# Patient Record
Sex: Female | Born: 1955
Health system: Southern US, Community
[De-identification: ages and names within clinical notes are randomized; demographics above are authoritative.]

## PROBLEM LIST (undated history)

## (undated) DIAGNOSIS — E7800 Pure hypercholesterolemia, unspecified: Secondary | ICD-10-CM

## (undated) DIAGNOSIS — I10 Essential (primary) hypertension: Secondary | ICD-10-CM

## (undated) DIAGNOSIS — E119 Type 2 diabetes mellitus without complications: Secondary | ICD-10-CM

## (undated) DIAGNOSIS — Z9289 Personal history of other medical treatment: Secondary | ICD-10-CM

## (undated) HISTORY — DX: Morbid (severe) obesity due to excess calories: E66.01

## (undated) HISTORY — DX: Personal history of other medical treatment: Z92.89

## (undated) HISTORY — DX: Pure hypercholesterolemia, unspecified: E78.00

## (undated) HISTORY — DX: Essential (primary) hypertension: I10

---

## 2001-06-29 HISTORY — PX: PARTIAL HYSTERECTOMY: SHX80

## 2010-05-20 ENCOUNTER — Encounter: Payer: Self-pay | Admitting: Obstetrics and Gynecology

## 2010-05-20 ENCOUNTER — Inpatient Hospital Stay (HOSPITAL_COMMUNITY): Admission: RE | Admit: 2010-05-20 | Discharge: 2010-05-22 | Payer: Self-pay | Admitting: Obstetrics and Gynecology

## 2010-12-13 LAB — BASIC METABOLIC PANEL
BUN: 11 mg/dL (ref 6–23)
CO2: 28 mEq/L (ref 19–32)
Calcium: 9.1 mg/dL (ref 8.4–10.5)
Chloride: 105 mEq/L (ref 96–112)
Creatinine, Ser: 0.83 mg/dL (ref 0.4–1.2)
Glucose, Bld: 110 mg/dL — ABNORMAL HIGH (ref 70–99)

## 2010-12-13 LAB — CBC
HCT: 38 % (ref 36.0–46.0)
MCH: 27.4 pg (ref 26.0–34.0)
MCH: 28 pg (ref 26.0–34.0)
MCHC: 33 g/dL (ref 30.0–36.0)
MCHC: 33.7 g/dL (ref 30.0–36.0)
MCV: 82.8 fL (ref 78.0–100.0)
MCV: 83 fL (ref 78.0–100.0)
Platelets: 223 10*3/uL (ref 150–400)
Platelets: 269 10*3/uL (ref 150–400)
RDW: 14.8 % (ref 11.5–15.5)
WBC: 8.5 10*3/uL (ref 4.0–10.5)

## 2011-01-27 DIAGNOSIS — Z9289 Personal history of other medical treatment: Secondary | ICD-10-CM

## 2011-01-27 HISTORY — DX: Personal history of other medical treatment: Z92.89

## 2011-06-10 ENCOUNTER — Other Ambulatory Visit (HOSPITAL_COMMUNITY)
Admission: RE | Admit: 2011-06-10 | Discharge: 2011-06-10 | Disposition: A | Discharge status: Home / Self Care | Payer: BC Managed Care – PPO | Source: Ambulatory Visit | Attending: Obstetrics and Gynecology | Admitting: Obstetrics and Gynecology

## 2011-06-10 ENCOUNTER — Other Ambulatory Visit: Payer: Self-pay | Admitting: Obstetrics and Gynecology

## 2011-06-10 DIAGNOSIS — Z1272 Encounter for screening for malignant neoplasm of vagina: Secondary | ICD-10-CM | POA: Insufficient documentation

## 2011-06-10 DIAGNOSIS — Z1159 Encounter for screening for other viral diseases: Secondary | ICD-10-CM | POA: Insufficient documentation

## 2012-06-29 ENCOUNTER — Other Ambulatory Visit (HOSPITAL_COMMUNITY)
Admission: RE | Admit: 2012-06-29 | Discharge: 2012-06-29 | Disposition: A | Discharge status: Home / Self Care | Payer: BC Managed Care – PPO | Source: Ambulatory Visit | Attending: Obstetrics and Gynecology | Admitting: Obstetrics and Gynecology

## 2012-06-29 ENCOUNTER — Other Ambulatory Visit: Payer: Self-pay | Admitting: Obstetrics and Gynecology

## 2012-06-29 DIAGNOSIS — Z1151 Encounter for screening for human papillomavirus (HPV): Secondary | ICD-10-CM | POA: Insufficient documentation

## 2012-06-29 DIAGNOSIS — Z124 Encounter for screening for malignant neoplasm of cervix: Secondary | ICD-10-CM | POA: Insufficient documentation

## 2013-05-26 ENCOUNTER — Encounter: Payer: Self-pay | Admitting: *Deleted

## 2013-05-27 ENCOUNTER — Encounter: Payer: Self-pay | Admitting: Cardiovascular Disease

## 2013-05-31 ENCOUNTER — Ambulatory Visit: Payer: BC Managed Care – PPO | Admitting: Cardiovascular Disease

## 2013-06-10 ENCOUNTER — Ambulatory Visit: Payer: BC Managed Care – PPO | Admitting: Cardiovascular Disease

## 2014-01-16 ENCOUNTER — Other Ambulatory Visit: Payer: Self-pay | Admitting: Internal Medicine

## 2014-01-16 DIAGNOSIS — Z139 Encounter for screening, unspecified: Secondary | ICD-10-CM

## 2014-01-16 DIAGNOSIS — Z780 Asymptomatic menopausal state: Secondary | ICD-10-CM

## 2014-01-17 ENCOUNTER — Ambulatory Visit: Payer: Self-pay | Admitting: Podiatry

## 2014-01-18 ENCOUNTER — Ambulatory Visit: Payer: Self-pay | Admitting: Podiatry

## 2014-01-27 ENCOUNTER — Ambulatory Visit: Payer: Self-pay | Admitting: Podiatry

## 2015-11-17 ENCOUNTER — Emergency Department (HOSPITAL_BASED_OUTPATIENT_CLINIC_OR_DEPARTMENT_OTHER): Payer: BLUE CROSS/BLUE SHIELD

## 2015-11-17 ENCOUNTER — Emergency Department (HOSPITAL_BASED_OUTPATIENT_CLINIC_OR_DEPARTMENT_OTHER)
Admission: EM | Admit: 2015-11-17 | Discharge: 2015-11-17 | Disposition: A | Discharge status: Home / Self Care | Payer: BLUE CROSS/BLUE SHIELD | Attending: Emergency Medicine | Admitting: Emergency Medicine

## 2015-11-17 ENCOUNTER — Encounter (HOSPITAL_BASED_OUTPATIENT_CLINIC_OR_DEPARTMENT_OTHER): Payer: Self-pay

## 2015-11-17 DIAGNOSIS — Z7982 Long term (current) use of aspirin: Secondary | ICD-10-CM | POA: Diagnosis not present

## 2015-11-17 DIAGNOSIS — E7800 Pure hypercholesterolemia, unspecified: Secondary | ICD-10-CM | POA: Diagnosis not present

## 2015-11-17 DIAGNOSIS — Z79899 Other long term (current) drug therapy: Secondary | ICD-10-CM | POA: Diagnosis not present

## 2015-11-17 DIAGNOSIS — M791 Myalgia: Secondary | ICD-10-CM | POA: Diagnosis not present

## 2015-11-17 DIAGNOSIS — Z7984 Long term (current) use of oral hypoglycemic drugs: Secondary | ICD-10-CM | POA: Insufficient documentation

## 2015-11-17 DIAGNOSIS — R509 Fever, unspecified: Secondary | ICD-10-CM | POA: Insufficient documentation

## 2015-11-17 DIAGNOSIS — R05 Cough: Secondary | ICD-10-CM | POA: Insufficient documentation

## 2015-11-17 DIAGNOSIS — R51 Headache: Secondary | ICD-10-CM | POA: Insufficient documentation

## 2015-11-17 DIAGNOSIS — R6889 Other general symptoms and signs: Secondary | ICD-10-CM

## 2015-11-17 DIAGNOSIS — R0981 Nasal congestion: Secondary | ICD-10-CM | POA: Diagnosis not present

## 2015-11-17 DIAGNOSIS — I10 Essential (primary) hypertension: Secondary | ICD-10-CM | POA: Diagnosis not present

## 2015-11-17 LAB — CBC WITH DIFFERENTIAL/PLATELET
BASOS ABS: 0 10*3/uL (ref 0.0–0.1)
Basophils Relative: 0 %
EOS ABS: 0 10*3/uL (ref 0.0–0.7)
EOS PCT: 0 %
HCT: 35 % — ABNORMAL LOW (ref 36.0–46.0)
Hemoglobin: 11.3 g/dL — ABNORMAL LOW (ref 12.0–15.0)
LYMPHS PCT: 8 %
Lymphs Abs: 0.8 10*3/uL (ref 0.7–4.0)
MCH: 25.2 pg — ABNORMAL LOW (ref 26.0–34.0)
MCHC: 32.3 g/dL (ref 30.0–36.0)
MCV: 78.1 fL (ref 78.0–100.0)
Monocytes Absolute: 1.3 10*3/uL — ABNORMAL HIGH (ref 0.1–1.0)
Monocytes Relative: 13 %
NEUTROS PCT: 79 %
Neutro Abs: 7.3 10*3/uL (ref 1.7–7.7)
PLATELETS: 218 10*3/uL (ref 150–400)
RBC: 4.48 MIL/uL (ref 3.87–5.11)
RDW: 14.8 % (ref 11.5–15.5)
WBC: 9.4 10*3/uL (ref 4.0–10.5)

## 2015-11-17 LAB — BASIC METABOLIC PANEL
ANION GAP: 7 (ref 5–15)
BUN: 12 mg/dL (ref 6–20)
CO2: 24 mmol/L (ref 22–32)
Calcium: 8.6 mg/dL — ABNORMAL LOW (ref 8.9–10.3)
Chloride: 105 mmol/L (ref 101–111)
Creatinine, Ser: 0.74 mg/dL (ref 0.44–1.00)
GFR calc Af Amer: >60 mL/min (ref >60)
Glucose, Bld: 110 mg/dL — ABNORMAL HIGH (ref 65–99)
POTASSIUM: 3.3 mmol/L — AB (ref 3.5–5.1)
SODIUM: 136 mmol/L (ref 135–145)

## 2015-11-17 MED ORDER — FENTANYL CITRATE (PF) 100 MCG/2ML IJ SOLN
50.0000 ug | Freq: Once | INTRAMUSCULAR | Status: AC
  Administered 2015-11-17: 50 ug via INTRAVENOUS
  Filled 2015-11-17: qty 2

## 2015-11-17 MED ORDER — DM-GUAIFENESIN ER 30-600 MG PO TB12: 1.0000 | ORAL_TABLET | Freq: Two times a day (BID) | ORAL | Status: DC

## 2015-11-17 MED ORDER — SODIUM CHLORIDE 0.9 % IV BOLUS (SEPSIS)
1000.0000 mL | Freq: Once | INTRAVENOUS | Status: AC
  Administered 2015-11-17: 1000 mL via INTRAVENOUS

## 2015-11-17 MED ORDER — ACETAMINOPHEN 325 MG PO TABS
650.0000 mg | ORAL_TABLET | Freq: Once | ORAL | Status: AC
  Administered 2015-11-17: 650 mg via ORAL
  Filled 2015-11-17: qty 2

## 2015-11-17 MED ORDER — NAPROXEN 500 MG PO TABS: 500.0000 mg | ORAL_TABLET | Freq: Two times a day (BID) | ORAL | Status: DC

## 2015-11-17 MED ORDER — SODIUM CHLORIDE 0.9 % IV SOLN: INTRAVENOUS | Status: DC

## 2015-11-17 MED ORDER — OSELTAMIVIR PHOSPHATE 75 MG PO CAPS: 75.0000 mg | ORAL_CAPSULE | Freq: Two times a day (BID) | ORAL | Status: DC

## 2015-11-17 NOTE — Discharge Instructions (Signed)
Symptoms seem to be consistent with the flu. Take Tamiflu as directed. Take the Naprosyn for the bodyaches and headache. Take Tylenol for fever. Take Mucinex DM for cough and phlegm. Return for any new or worse symptoms. Drink plenty of fluids.

## 2015-11-17 NOTE — ED Notes (Signed)
Patient here with cough, body aches, fever x 1 day. Reports that the symptoms came on suddenly with headache. Has not taken otc meds pta. Alert and oriented

## 2015-11-17 NOTE — ED Provider Notes (Signed)
CSN: [id number]     Arrival date & time 11/17/15  1032 History   First MD Initiated Contact with Patient 11/17/15 1042     No chief complaint on file.    (Consider location/radiation/quality/duration/timing/severity/associated sxs/prior Treatment) The history is provided by the patient.   60 year old female acute onset last evening of cough occasionally productive bodyaches fever congestion. No nausea vomiting or diarrhea. No known sick contact.  Past Medical History  Diagnosis Date  . Morbid obesity (HCC)   . Hypertension   . Hypercholesterolemia   . History of stress test 01/27/2011    normal myocardial perfusion study, no significant abnormalities.   Past Surgical History  Procedure Laterality Date  . Partial hysterectomy  06/29/2001   Family History  Problem Relation Age of Onset  . Hypertension Sister   . Hyperlipidemia Sister   . Heart disease Father   . Hypertension Father   . Cancer Paternal Grandmother    Social History  Substance Use Topics  . Smoking status: Never Smoker   . Smokeless tobacco: None  . Alcohol Use: None   OB History    No data available     Review of Systems  Constitutional: Positive for fever.  HENT: Positive for congestion.   Eyes: Negative for redness.  Respiratory: Positive for cough. Negative for shortness of breath and wheezing.   Cardiovascular: Negative for chest pain.  Gastrointestinal: Negative for nausea, vomiting, abdominal pain and diarrhea.  Genitourinary: Negative for dysuria.  Musculoskeletal: Positive for myalgias. Negative for neck stiffness.  Skin: Negative for rash.  Neurological: Positive for headaches.  Hematological: Does not bruise/bleed easily.  Psychiatric/Behavioral: Negative for confusion.      Allergies  Review of patient's allergies indicates no known allergies.  Home Medications   Prior to Admission medications   Medication Sig Start Date End Date Taking? Authorizing Provider  metFORMIN  (GLUCOPHAGE) 1000 MG tablet Take 1,000 mg by mouth 2 (two) times daily with a meal.   Yes Historical Provider, MD  aspirin 325 MG tablet Take 325 mg by mouth daily.    Historical Provider, MD  CINNAMON PO Take 1 tablet by mouth 2 (two) times daily.    Historical Provider, MD  dextromethorphan-guaiFENesin (MUCINEX DM) 30-600 MG 12hr tablet Take 1 tablet by mouth 2 (two) times daily. 11/17/15   Vanetta Mulders, MD  gabapentin (NEURONTIN) 300 MG capsule Take 300 mg by mouth daily.    Historical Provider, MD  losartan (COZAAR) 25 MG tablet Take 25 mg by mouth daily.    Historical Provider, MD  lovastatin (MEVACOR) 20 MG tablet Take 20 mg by mouth at bedtime.    Historical Provider, MD  naproxen (NAPROSYN) 500 MG tablet Take 1 tablet (500 mg total) by mouth 2 (two) times daily. 11/17/15   Vanetta Mulders, MD  oseltamivir (TAMIFLU) 75 MG capsule Take 1 capsule (75 mg total) by mouth every 12 (twelve) hours. 11/17/15   Vanetta Mulders, MD   BP 128/64 mmHg  Pulse 102  Temp(Src) 100.8 F (38.2 C) (Oral)  Resp 24  Ht  (1.575 m)  Wt 106.142 kg  BMI 42.79 kg/m2  SpO2 96% Physical Exam  Constitutional: She is oriented to person, place, and time. She appears well-developed and well-nourished. No distress.  HENT:  Head: Normocephalic and atraumatic.  Mouth/Throat: No oropharyngeal exudate.  Eyes: Conjunctivae and EOM are normal. Pupils are equal, round, and reactive to light.  Neck: Normal range of motion. Neck supple.  Cardiovascular: Normal rate, regular rhythm and  normal heart sounds.   No murmur heard. Pulmonary/Chest: Effort normal and breath sounds normal. No respiratory distress. She has no wheezes. She has no rales.  Abdominal: Soft. Bowel sounds are normal. There is no tenderness.  Musculoskeletal: Normal range of motion.  Neurological: She is alert and oriented to person, place, and time. No cranial nerve deficit. She exhibits normal muscle tone. Coordination normal.  Skin: Skin is warm.   Nursing note and vitals reviewed.   ED Course  Procedures (including critical care time) Labs Review Labs Reviewed  CBC WITH DIFFERENTIAL/PLATELET - Abnormal; Notable for the following:    Hemoglobin 11.3 (*)    HCT 35.0 (*)    MCH 25.2 (*)    Monocytes Absolute 1.3 (*)    All other components within normal limits  BASIC METABOLIC PANEL - Abnormal; Notable for the following:    Potassium 3.3 (*)    Glucose, Bld 110 (*)    Calcium 8.6 (*)    All other components within normal limits   Results for orders placed or performed during the hospital encounter of 11/17/15  CBC with Differential/Platelet  Result Value Ref Range   WBC 9.4 4.0 - 10.5 K/uL   RBC 4.48 3.87 - 5.11 MIL/uL   Hemoglobin 11.3 (L) 12.0 - 15.0 g/dL   HCT 16.1 (L) 09.6 - 04.5 %   MCV 78.1 78.0 - 100.0 fL   MCH 25.2 (L) 26.0 - 34.0 pg   MCHC 32.3 30.0 - 36.0 g/dL   RDW 40.9 81.1 - 91.4 %   Platelets 218 150 - 400 K/uL   Neutrophils Relative % 79 %   Neutro Abs 7.3 1.7 - 7.7 K/uL   Lymphocytes Relative 8 %   Lymphs Abs 0.8 0.7 - 4.0 K/uL   Monocytes Relative 13 %   Monocytes Absolute 1.3 (H) 0.1 - 1.0 K/uL   Eosinophils Relative 0 %   Eosinophils Absolute 0.0 0.0 - 0.7 K/uL   Basophils Relative 0 %   Basophils Absolute 0.0 0.0 - 0.1 K/uL  Basic metabolic panel  Result Value Ref Range   Sodium 136 135 - 145 mmol/L   Potassium 3.3 (L) 3.5 - 5.1 mmol/L   Chloride 105 101 - 111 mmol/L   CO2 24 22 - 32 mmol/L   Glucose, Bld 110 (H) 65 - 99 mg/dL   BUN 12 6 - 20 mg/dL   Creatinine, Ser 7.82 0.44 - 1.00 mg/dL   Calcium 8.6 (L) 8.9 - 10.3 mg/dL   GFR calc non Af Amer >60 >60 mL/min   GFR calc Af Amer >60 >60 mL/min   Anion gap 7 5 - 15     Imaging Review Dg Chest 2 View  11/17/2015  CLINICAL DATA:  Initial encounter. 60 y/o female here with c/o body aches, productive cough, sternal CP, headache, fever x2 days. Hx HTN - on meds, non-smoker EXAM: CHEST  2 VIEW COMPARISON:  None. FINDINGS: The heart size  and mediastinal contours are within normal limits. Both lungs are clear. No pleural effusion or pneumothorax. The visualized skeletal structures are unremarkable. IMPRESSION: No active cardiopulmonary disease. Electronically Signed   By: Amie Portland M.D.   On: 11/17/2015 11:25   I have personally reviewed and evaluated these images and lab results as part of my medical decision-making.   EKG Interpretation None      MDM   Final diagnoses:  Flu-like symptoms    Patient with acute onset of bodyaches fever cough yesterday evening. Also cough  is productive. Febrile here improved with Tylenol. Chest x-ray negative for pneumonia. Symptoms consistent with the flu. Patient will be treated symptomatically with Tamiflu Mucinex DM Naprosyn and Tylenol.    Vanetta Mulders, MD 11/17/15 1300

## 2016-06-17 ENCOUNTER — Observation Stay (HOSPITAL_BASED_OUTPATIENT_CLINIC_OR_DEPARTMENT_OTHER)
Admission: EM | Admit: 2016-06-17 | Discharge: 2016-06-20 | Disposition: A | Discharge status: Home / Self Care | Payer: BLUE CROSS/BLUE SHIELD | Attending: General Surgery | Admitting: General Surgery

## 2016-06-17 ENCOUNTER — Emergency Department (HOSPITAL_BASED_OUTPATIENT_CLINIC_OR_DEPARTMENT_OTHER): Payer: BLUE CROSS/BLUE SHIELD

## 2016-06-17 ENCOUNTER — Encounter (HOSPITAL_BASED_OUTPATIENT_CLINIC_OR_DEPARTMENT_OTHER): Payer: Self-pay | Admitting: Emergency Medicine

## 2016-06-17 ENCOUNTER — Emergency Department (HOSPITAL_COMMUNITY): Payer: BLUE CROSS/BLUE SHIELD

## 2016-06-17 DIAGNOSIS — Z7984 Long term (current) use of oral hypoglycemic drugs: Secondary | ICD-10-CM | POA: Insufficient documentation

## 2016-06-17 DIAGNOSIS — E119 Type 2 diabetes mellitus without complications: Secondary | ICD-10-CM | POA: Insufficient documentation

## 2016-06-17 DIAGNOSIS — R109 Unspecified abdominal pain: Secondary | ICD-10-CM | POA: Diagnosis present

## 2016-06-17 DIAGNOSIS — K8020 Calculus of gallbladder without cholecystitis without obstruction: Secondary | ICD-10-CM

## 2016-06-17 DIAGNOSIS — E7800 Pure hypercholesterolemia, unspecified: Secondary | ICD-10-CM | POA: Insufficient documentation

## 2016-06-17 DIAGNOSIS — Z7982 Long term (current) use of aspirin: Secondary | ICD-10-CM | POA: Diagnosis not present

## 2016-06-17 DIAGNOSIS — Z6841 Body Mass Index (BMI) 40.0 and over, adult: Secondary | ICD-10-CM | POA: Diagnosis not present

## 2016-06-17 DIAGNOSIS — I10 Essential (primary) hypertension: Secondary | ICD-10-CM | POA: Insufficient documentation

## 2016-06-17 DIAGNOSIS — R1010 Upper abdominal pain, unspecified: Secondary | ICD-10-CM

## 2016-06-17 DIAGNOSIS — K8012 Calculus of gallbladder with acute and chronic cholecystitis without obstruction: Principal | ICD-10-CM | POA: Insufficient documentation

## 2016-06-17 DIAGNOSIS — Z79899 Other long term (current) drug therapy: Secondary | ICD-10-CM | POA: Diagnosis not present

## 2016-06-17 HISTORY — DX: Type 2 diabetes mellitus without complications: E11.9

## 2016-06-17 LAB — GLUCOSE, CAPILLARY
GLUCOSE-CAPILLARY: 115 mg/dL — AB (ref 65–99)
GLUCOSE-CAPILLARY: 133 mg/dL — AB (ref 65–99)
Glucose-Capillary: 108 mg/dL — ABNORMAL HIGH (ref 65–99)

## 2016-06-17 LAB — URINALYSIS, ROUTINE W REFLEX MICROSCOPIC
BILIRUBIN URINE: NEGATIVE
Glucose, UA: 100 mg/dL — AB
Ketones, ur: NEGATIVE mg/dL
Leukocytes, UA: NEGATIVE
Nitrite: NEGATIVE
PH: 7.5 (ref 5.0–8.0)
Protein, ur: NEGATIVE mg/dL
SPECIFIC GRAVITY, URINE: 1.031 — AB (ref 1.005–1.030)

## 2016-06-17 LAB — CREATININE, SERUM
Creatinine, Ser: 0.79 mg/dL (ref 0.44–1.00)
GFR calc non Af Amer: >60 mL/min (ref >60)

## 2016-06-17 LAB — COMPREHENSIVE METABOLIC PANEL
ALK PHOS: 72 U/L (ref 38–126)
ALT: 24 U/L (ref 14–54)
AST: 25 U/L (ref 15–41)
Albumin: 4 g/dL (ref 3.5–5.0)
Anion gap: 7 (ref 5–15)
BUN: 14 mg/dL (ref 6–20)
CALCIUM: 9.1 mg/dL (ref 8.9–10.3)
CHLORIDE: 106 mmol/L (ref 101–111)
CO2: 25 mmol/L (ref 22–32)
CREATININE: 0.77 mg/dL (ref 0.44–1.00)
GFR calc non Af Amer: >60 mL/min (ref >60)
Glucose, Bld: 198 mg/dL — ABNORMAL HIGH (ref 65–99)
Potassium: 3.7 mmol/L (ref 3.5–5.1)
SODIUM: 138 mmol/L (ref 135–145)
Total Bilirubin: 0.2 mg/dL — ABNORMAL LOW (ref 0.3–1.2)
Total Protein: 7.2 g/dL (ref 6.5–8.1)

## 2016-06-17 LAB — CBC
HCT: 37 % (ref 36.0–46.0)
HEMATOCRIT: 37 % (ref 36.0–46.0)
HEMOGLOBIN: 11.8 g/dL — AB (ref 12.0–15.0)
Hemoglobin: 12.1 g/dL (ref 12.0–15.0)
MCH: 25.8 pg — AB (ref 26.0–34.0)
MCH: 25.2 pg — ABNORMAL LOW (ref 26.0–34.0)
MCHC: 32.7 g/dL (ref 30.0–36.0)
MCHC: 31.9 g/dL (ref 30.0–36.0)
MCV: 78.9 fL (ref 78.0–100.0)
MCV: 79.1 fL (ref 78.0–100.0)
Platelets: 238 10*3/uL (ref 150–400)
Platelets: 187 10*3/uL (ref 150–400)
RBC: 4.69 MIL/uL (ref 3.87–5.11)
RBC: 4.68 MIL/uL (ref 3.87–5.11)
RDW: 14.8 % (ref 11.5–15.5)
RDW: 14.9 % (ref 11.5–15.5)
WBC: 11.1 10*3/uL — ABNORMAL HIGH (ref 4.0–10.5)
WBC: 14.7 10*3/uL — AB (ref 4.0–10.5)

## 2016-06-17 LAB — URINE MICROSCOPIC-ADD ON

## 2016-06-17 LAB — SURGICAL PCR SCREEN
MRSA, PCR: NEGATIVE
STAPHYLOCOCCUS AUREUS: NEGATIVE

## 2016-06-17 LAB — CBG MONITORING, ED: GLUCOSE-CAPILLARY: 187 mg/dL — AB (ref 65–99)

## 2016-06-17 LAB — LIPASE, BLOOD: LIPASE: 28 U/L (ref 11–51)

## 2016-06-17 MED ORDER — ACETAMINOPHEN 650 MG RE SUPP
650.0000 mg | Freq: Four times a day (QID) | RECTAL | Status: DC | PRN
Start: 2016-06-17 — End: 2016-06-20

## 2016-06-17 MED ORDER — ONDANSETRON HCL 4 MG/2ML IJ SOLN
4.0000 mg | Freq: Once | INTRAMUSCULAR | Status: AC
  Administered 2016-06-17: 4 mg via INTRAVENOUS
  Filled 2016-06-17: qty 2

## 2016-06-17 MED ORDER — ONDANSETRON HCL 4 MG/2ML IJ SOLN: 4.0000 mg | Freq: Four times a day (QID) | INTRAMUSCULAR | Status: DC | PRN

## 2016-06-17 MED ORDER — HYDROMORPHONE HCL 1 MG/ML IJ SOLN
0.5000 mg | Freq: Once | INTRAMUSCULAR | Status: AC
Start: 2016-06-17 — End: 2016-06-17
  Administered 2016-06-17: 0.5 mg via INTRAVENOUS
  Filled 2016-06-17: qty 1

## 2016-06-17 MED ORDER — CEFTRIAXONE SODIUM 2 G IJ SOLR
2.0000 g | Freq: Once | INTRAMUSCULAR | Status: AC
  Administered 2016-06-17: 2 g via INTRAVENOUS
  Filled 2016-06-17: qty 2

## 2016-06-17 MED ORDER — ENOXAPARIN SODIUM 40 MG/0.4ML ~~LOC~~ SOLN
40.0000 mg | SUBCUTANEOUS | Status: DC
  Administered 2016-06-17 – 2016-06-19 (×3): 40 mg via SUBCUTANEOUS
  Filled 2016-06-17 (×3): qty 0.4

## 2016-06-17 MED ORDER — OXYCODONE HCL 5 MG PO TABS
5.0000 mg | ORAL_TABLET | ORAL | Status: DC | PRN
  Administered 2016-06-18 – 2016-06-19 (×3): 10 mg via ORAL
  Administered 2016-06-19: 5 mg via ORAL
  Administered 2016-06-19 – 2016-06-20 (×3): 10 mg via ORAL
  Filled 2016-06-17: qty 2
  Filled 2016-06-17: qty 1
  Filled 2016-06-17 (×5): qty 2

## 2016-06-17 MED ORDER — DEXTROSE 5 % IV SOLN
2.0000 g | INTRAVENOUS | Status: DC
  Administered 2016-06-18 – 2016-06-19 (×2): 2 g via INTRAVENOUS
  Filled 2016-06-17 (×3): qty 2

## 2016-06-17 MED ORDER — MORPHINE SULFATE (PF) 4 MG/ML IV SOLN
4.0000 mg | Freq: Once | INTRAVENOUS | Status: AC
Start: 2016-06-17 — End: 2016-06-17
  Administered 2016-06-17: 4 mg via INTRAVENOUS
  Filled 2016-06-17: qty 1

## 2016-06-17 MED ORDER — INSULIN ASPART 100 UNIT/ML ~~LOC~~ SOLN
0.0000 [IU] | SUBCUTANEOUS | Status: DC
  Administered 2016-06-17: 2 [IU] via SUBCUTANEOUS
  Administered 2016-06-18: 3 [IU] via SUBCUTANEOUS
  Administered 2016-06-18 (×2): 2 [IU] via SUBCUTANEOUS

## 2016-06-17 MED ORDER — SODIUM CHLORIDE 0.45 % IV SOLN
INTRAVENOUS | Status: DC
  Administered 2016-06-17: 1000 mL via INTRAVENOUS

## 2016-06-17 MED ORDER — BISACODYL 5 MG PO TBEC: 5.0000 mg | DELAYED_RELEASE_TABLET | Freq: Every day | ORAL | Status: DC | PRN

## 2016-06-17 MED ORDER — DIPHENHYDRAMINE HCL 25 MG PO CAPS: 25.0000 mg | ORAL_CAPSULE | Freq: Four times a day (QID) | ORAL | Status: DC | PRN

## 2016-06-17 MED ORDER — SODIUM CHLORIDE 0.9 % IV BOLUS (SEPSIS)
1000.0000 mL | Freq: Once | INTRAVENOUS | Status: AC
  Administered 2016-06-17: 1000 mL via INTRAVENOUS

## 2016-06-17 MED ORDER — HYDRALAZINE HCL 20 MG/ML IJ SOLN: 5.0000 mg | INTRAMUSCULAR | Status: DC | PRN

## 2016-06-17 MED ORDER — VITAMIN B-12 1000 MCG PO TABS
1000.0000 ug | ORAL_TABLET | Freq: Every day | ORAL | Status: DC
  Administered 2016-06-17: 1000 ug via ORAL
  Filled 2016-06-17: qty 1

## 2016-06-17 MED ORDER — PRAVASTATIN SODIUM 20 MG PO TABS
20.0000 mg | ORAL_TABLET | Freq: Every day | ORAL | Status: DC
  Administered 2016-06-18 – 2016-06-19 (×2): 20 mg via ORAL
  Filled 2016-06-17 (×2): qty 1

## 2016-06-17 MED ORDER — ACETAMINOPHEN 325 MG PO TABS
650.0000 mg | ORAL_TABLET | Freq: Four times a day (QID) | ORAL | Status: DC | PRN
  Administered 2016-06-18 – 2016-06-19 (×2): 650 mg via ORAL
  Filled 2016-06-17 (×2): qty 2

## 2016-06-17 MED ORDER — POLYETHYLENE GLYCOL 3350 17 G PO PACK: 17.0000 g | PACK | Freq: Every day | ORAL | Status: DC | PRN

## 2016-06-17 MED ORDER — METHOCARBAMOL 500 MG PO TABS: 500.0000 mg | ORAL_TABLET | Freq: Four times a day (QID) | ORAL | Status: DC | PRN

## 2016-06-17 MED ORDER — DIPHENHYDRAMINE HCL 50 MG/ML IJ SOLN: 25.0000 mg | Freq: Four times a day (QID) | INTRAMUSCULAR | Status: DC | PRN

## 2016-06-17 MED ORDER — IOPAMIDOL (ISOVUE-300) INJECTION 61%
100.0000 mL | Freq: Once | INTRAVENOUS | Status: AC | PRN
  Administered 2016-06-17: 100 mL via INTRAVENOUS

## 2016-06-17 MED ORDER — ONDANSETRON 4 MG PO TBDP: 4.0000 mg | ORAL_TABLET | Freq: Four times a day (QID) | ORAL | Status: DC | PRN

## 2016-06-17 MED ORDER — MORPHINE SULFATE (PF) 2 MG/ML IV SOLN
2.0000 mg | INTRAVENOUS | Status: DC | PRN
  Administered 2016-06-17: 4 mg via INTRAVENOUS
  Administered 2016-06-17: 2 mg via INTRAVENOUS
  Administered 2016-06-17 – 2016-06-18 (×3): 4 mg via INTRAVENOUS
  Filled 2016-06-17 (×4): qty 2
  Filled 2016-06-17: qty 1

## 2016-06-17 NOTE — ED Notes (Signed)
Attempted report will call back in .

## 2016-06-17 NOTE — Progress Notes (Signed)
Patient came up to unit at 1710 from ED via stretcher. Alert and oriented x3. Up adlib. Medicated with morphine 2 mg IV for abdominal pain. Oriented to room and environment. IVF initiated. Vital signs obtainedby NT. Placed comfortably in bed. Will continue to monitor.

## 2016-06-17 NOTE — ED Notes (Signed)
CBG 187, patient also is vomiting at this time. RN Keli notified.

## 2016-06-17 NOTE — ED Notes (Signed)
Patient transported to CT 

## 2016-06-17 NOTE — ED Provider Notes (Signed)
[facility]   CSN: [id number] Arrival date & time: 06/17/16  0754     History   Chief Complaint Chief Complaint  Patient presents with  . Abdominal Pain    HPI Samantha Santiago is a 60 y.o. female.  HPI Patient presents with upper abdominal pain that woke her from her sleep at 4 AM. Pain has been constant since onset. No previously similar pain. Patient states she ate meat loaf around 9:00 last night. States she's had nausea with 6 episodes of vomiting. No diarrhea. Had normal stool this morning. No fever or chills. No previous abdominal surgery. Denies increased urinary frequency, dysuria, flank pain or hematuria. No vaginal complaints. Past Medical History:  Diagnosis Date  . Diabetes mellitus without complication (HCC)   . History of stress test 01/27/2011   normal myocardial perfusion study, no significant abnormalities.  . Hypercholesterolemia   . Hypertension   . Morbid obesity (HCC)     There are no active problems to display for this patient.   Past Surgical History:  Procedure Laterality Date  . PARTIAL HYSTERECTOMY  06/29/2001    OB History    No data available       Home Medications    Prior to Admission medications   Medication Sig Start Date End Date Taking? Authorizing Provider  aspirin 325 MG tablet Take 325 mg by mouth daily.    Historical Provider, MD  CINNAMON PO Take 1 tablet by mouth 2 (two) times daily.    Historical Provider, MD  dextromethorphan-guaiFENesin (MUCINEX DM) 30-600 MG 12hr tablet Take 1 tablet by mouth 2 (two) times daily. 11/17/15   Vanetta Mulders, MD  gabapentin (NEURONTIN) 300 MG capsule Take 300 mg by mouth daily.    Historical Provider, MD  losartan (COZAAR) 25 MG tablet Take 25 mg by mouth daily.    Historical Provider, MD  lovastatin (MEVACOR) 20 MG tablet Take 20 mg by mouth at bedtime.    Historical Provider, MD  metFORMIN (GLUCOPHAGE) 1000 MG tablet Take 1,000 mg by mouth 2 (two) times daily  with a meal.    Historical Provider, MD  naproxen (NAPROSYN) 500 MG tablet Take 1 tablet (500 mg total) by mouth 2 (two) times daily. 11/17/15   Vanetta Mulders, MD  oseltamivir (TAMIFLU) 75 MG capsule Take 1 capsule (75 mg total) by mouth every 12 (twelve) hours. 11/17/15   Vanetta Mulders, MD    Family History Family History  Problem Relation Age of Onset  . Hypertension Sister   . Hyperlipidemia Sister   . Heart disease Father   . Hypertension Father   . Cancer Paternal Grandmother     Social History Social History  Substance Use Topics  . Smoking status: Never Smoker  . Smokeless tobacco: Never Used  . Alcohol use No     Allergies   Review of patient's allergies indicates no known allergies.   Review of Systems Review of Systems  Constitutional: Negative for chills and fever.  Respiratory: Negative for chest tightness and shortness of breath.   Cardiovascular: Negative for chest pain.  Gastrointestinal: Positive for abdominal pain, nausea and vomiting. Negative for blood in stool, constipation and diarrhea.  Genitourinary: Negative for dysuria, flank pain, frequency, hematuria, pelvic pain, vaginal bleeding and vaginal discharge.  Neurological: Negative for dizziness, weakness, light-headedness, numbness and headaches.  All other systems reviewed and are negative.    Physical Exam Updated Vital Signs BP 146/64   Pulse 75   Temp 98 F (  36.7 C) (Oral)   Resp 24   Ht 5\' 3"  (1.6 m)   Wt 239 lb (108.4 kg)   SpO2 95%   BMI 42.34 kg/m   Physical Exam  Constitutional: She is oriented to person, place, and time. She appears well-developed and well-nourished. She appears distressed.  HENT:  Head: Normocephalic and atraumatic.  Mouth/Throat: Oropharynx is clear and moist.  Eyes: EOM are normal. Pupils are equal, round, and reactive to light.  Neck: Normal range of motion. Neck supple.  Cardiovascular: Normal rate and regular rhythm.   Pulmonary/Chest: Effort normal  and breath sounds normal. No respiratory distress. She has no wheezes. She has no rales. She exhibits no tenderness.  Abdominal: Soft. Bowel sounds are normal. There is tenderness (patient with right upper quadrant and epigastric tenderness to palpation.). There is rebound (appears to have some rebound tenderness.). There is no guarding.  Musculoskeletal: Normal range of motion. She exhibits edema (1+ bilateral lower extremity pitting edema). She exhibits no tenderness.  No flank tenderness to percussion.  Neurological: She is alert and oriented to person, place, and time.  Moving all extremities without deficit. Sensation grossly intact.  Skin: Skin is warm and dry. Capillary refill takes less than 2 seconds. No rash noted. No erythema.  Psychiatric: She has a normal mood and affect. Her behavior is normal.  Nursing note and vitals reviewed.    ED Treatments / Results  Labs (all labs ordered are listed, but only abnormal results are displayed) Labs Reviewed  COMPREHENSIVE METABOLIC PANEL - Abnormal; Notable for the following:       Result Value   Glucose, Bld 198 (*)    Total Bilirubin 0.2 (*)    All other components within normal limits  CBC - Abnormal; Notable for the following:    WBC 11.1 (*)    MCH 25.8 (*)    All other components within normal limits  CBG MONITORING, ED - Abnormal; Notable for the following:    Glucose-Capillary 187 (*)    All other components within normal limits  LIPASE, BLOOD  URINALYSIS, ROUTINE W REFLEX MICROSCOPIC (NOT [facility])    EKG  EKG Interpretation None       Radiology Ct Abdomen Pelvis W Contrast  Result Date: 06/17/2016 CLINICAL DATA:  Upper abdominal pain, vomiting EXAM: CT ABDOMEN AND PELVIS WITH CONTRAST TECHNIQUE: Multidetector CT imaging of the abdomen and pelvis was performed using the standard protocol following bolus administration of intravenous contrast. CONTRAST:  100mL ISOVUE-300 IOPAMIDOL (ISOVUE-300) INJECTION 61%  COMPARISON:  None. FINDINGS: Lower chest: Areas of atelectasis in the lung bases bilaterally. No effusions. Hepatobiliary: 2.3 cm gallstone within the gallbladder. No focal hepatic abnormality. Pancreas: No focal abnormality or ductal dilatation. Spleen: No focal abnormality.  Normal size. Adrenals/Urinary Tract: No adrenal abnormality. No focal renal abnormality. No stones or hydronephrosis. Urinary bladder is unremarkable. Stomach/Bowel: Appendix is visualized and is normal. Stomach, large and small bowel grossly unremarkable. Vascular/Lymphatic: No evidence of aneurysm or adenopathy. Scattered aortic and iliac calcifications. Reproductive: Prior hysterectomy.  No adnexal mass. Other: No free fluid or free air. Musculoskeletal: No acute bony abnormality or focal bone lesion. IMPRESSION: Cholelithiasis. Bibasilar atelectasis. Normal appendix. No acute findings in the abdomen or pelvis. Electronically Signed   By: Charlett NoseKevin  Dover M.D.   On: 06/17/2016 09:56    Procedures Procedures (including critical care time)  Medications Ordered in ED Medications  HYDROmorphone (DILAUDID) injection 0.5 mg (not administered)  ondansetron (ZOFRAN) injection 4 mg (4 mg Intravenous Given  06/17/16 0831)  morphine 4 MG/ML injection 4 mg (4 mg Intravenous Given 06/17/16 0907)  sodium chloride 0.9 % bolus 1,000 mL (0 mLs Intravenous Stopped 06/17/16 1009)  iopamidol (ISOVUE-300) 61 % injection 100 mL (100 mLs Intravenous Contrast Given 06/17/16 0936)     Initial Impression / Assessment and Plan / ED Course  I have reviewed the triage vital signs and the nursing notes.  Pertinent labs & imaging results that were available during my care of the patient were reviewed by me and considered in my medical decision making (see chart for details).  Clinical Course   Patient has several bouts of vomiting while trying to drink by mouth contrast. The pain is persistent despite a couple doses of pain medication. CT shows  cholelithiasis without definite evidence of cholecystitis. Patient has normal LFTs with small elevation in white blood cell count. Discussed with Dr. Dwain Sarna and recommends transfer to the [facility] emergency department for ultrasound. He asked to be called when the patient arrives so he can evaluate the patient in the emergency department. Discuss with Dr. Patria Mane. Relayed Dr. Doreen Salvage wishes. Dr. Patria Mane will accept patient in transfer. Patient vital signs remain stable for discharge.  Final Clinical Impressions(s) / ED Diagnoses   Final diagnoses:  Calculus of gallbladder without cholecystitis without obstruction    New Prescriptions New Prescriptions   No medications on file     Loren Racer, MD 06/17/16 1036

## 2016-06-17 NOTE — ED Notes (Signed)
Family at bedside. 

## 2016-06-17 NOTE — ED Triage Notes (Signed)
States she woke up this am with pain to her generalized upper abdominal area.

## 2016-06-17 NOTE — ED Provider Notes (Signed)
Patient transfer from [facility] for RUQ ultrasound and general surgery evaluation.  Briefly[facility] am with a ssociated N/V.  CT today showed cholelithiasis without definite evidence of cholecystitis.  Normal LFTs, small leukocytosis.  Case was discussed with Dr. Dwain SarnaWakefield who [facility].  12:50 PM: spoke with Dr. Dwain SarnaWakefield, will page when ultrasound results.   1:45 PM: Ultrasound showed stone near neck without evidence of cholecystitis.  Patient continues to be in severe pain. Anticipate admission for IV pain control and possible surgery.  Will consult GS.  Started on Rocephin.  2:26 PM: Spoke with general surgery who evaluated the patient and will admit for IV pain control, abx, and lap chole tomorrow.      Cheri FowlerKayla Antron Seth, PA-C 06/17/16 1627    Jacalyn LefevreJulie Haviland, MD 06/17/16 [id number]

## 2016-06-17 NOTE — ED Notes (Signed)
US at bedside

## 2016-06-17 NOTE — ED Notes (Signed)
Pt ambulated with stand by assistance to the bathroom. She held onto the railing along the wall. An urine specimen was not obtained at this time.

## 2016-06-17 NOTE — H&P (Signed)
[facility] Admission Note  Samantha Santiago 1956-08-31  [id number].    Requesting MD: Julianne Rice Chief Complaint/Reason for Consult: Upper abdominal pain  HPI:  Samantha Santiago is a 60yo female who was transferred to [facility] from [facility] with acute onset severe upper abdominal pain with associated nausea and vomiting. States that the pain began early this morning and has been constant. Denies diarrhea, fever, chills, hematochezia, melena, constipation, and dysuria. No prior history of similar pain. Last meal was meatloaf ~2100 yesterday. Last BM yesterday.  CT today showed cholelithiasis without definite evidence of cholecystitis.  RUQ ultrasound also showed cholelithiasis without other ultrasound evidence of cholecystitis or biliary obstruction.  Normal LFTs and lipase, mild leukocytosis.   PMH significant for DM, HTN, HLD, obesity Abdominal surgical history includes partial hysterectomy 2002 Takes 52m ASA daily, no other anticoagulants Nonsmoker Employment: foster care  ROS: All systems reviewed and otherwise negative except for as above  Family History  Problem Relation Age of Onset  . Hypertension Sister   . Hyperlipidemia Sister   . Heart disease Father   . Hypertension Father   . Cancer Paternal Grandmother     Past Medical History:  Diagnosis Date  . Diabetes mellitus without complication ([place]   . History of stress test 01/27/2011   normal myocardial perfusion study, no significant abnormalities.  . Hypercholesterolemia   . Hypertension   . Morbid obesity ([place]     Past Surgical History:  Procedure Laterality Date  . PARTIAL HYSTERECTOMY  06/29/2001    Social History:  reports that she has never smoked. She has never used smokeless tobacco. She reports that she does not drink alcohol. Her drug history is not on file.  Allergies: No Known Allergies   (Not in a hospital admission)  Blood pressure 167/72, pulse 85, temperature 98.6 F (37 C),  temperature source Oral, resp. rate 16, height _0  (1.6 m), weight 239 lb (108.4 kg), SpO2 94 %.  Physical Exam: General: WD/WN AA female who is laying in bed in NAD HEENT: head is normocephalic, atraumatic.  Sclera are noninjected.  Mouth is pink and moist Heart: regular, rate, and rhythm.  No obvious murmurs, gallops, or rubs noted.  Palpable pedal pulses bilaterally Lungs: CTAB, no wheezes, rhonchi, or rales noted.  Respiratory effort nonlabored Abd: well healed vertical incision distal to umbilicus, obese, soft, +BS, no masses, hernias, or organomegaly. TTP RUQ MS: all 4 extremities are symmetrical with no cyanosis, clubbing, or edema. Skin: warm and dry with no masses, lesions, or rashes Psych: A&Ox3 with an appropriate affect.   Results for orders placed or performed during the hospital encounter of 06/17/16 (from the past 48 hour(s))  Lipase, blood     Status: None   Collection Time: 06/17/16  8:25 AM  Result Value Ref Range   Lipase 28 11 - 51 U/L  Comprehensive metabolic panel     Status: Abnormal   Collection Time: 06/17/16  8:25 AM  Result Value Ref Range   Sodium 138 135 - 145 mmol/L   Potassium 3.7 3.5 - 5.1 mmol/L   Chloride 106 101 - 111 mmol/L   CO2 25 22 - 32 mmol/L   Glucose, Bld 198 (H) 65 - 99 mg/dL   BUN 14 6 - 20 mg/dL   Creatinine, Ser 0.77 0.44 - 1.00 mg/dL   Calcium 9.1 8.9 - 10.3 mg/dL   Total Protein 7.2 6.5 - 8.1 g/dL   Albumin 4.0 3.5 - 5.0 g/dL  AST 25 15 - 41 U/L   ALT 24 14 - 54 U/L   Alkaline Phosphatase 72 38 - 126 U/L   Total Bilirubin 0.2 (L) 0.3 - 1.2 mg/dL   GFR calc non Af Amer >60 >60 mL/min   GFR calc Af Amer >60 >60 mL/min    Comment: (NOTE) The eGFR has been calculated using the CKD EPI equation. This calculation has not been validated in all clinical situations. eGFR's persistently <60 mL/min signify possible Chronic Kidney Disease.    Anion gap 7 5 - 15  CBC     Status: Abnormal   Collection Time: 06/17/16  8:25 AM  Result  Value Ref Range   WBC 11.1 (H) 4.0 - 10.5 K/uL   RBC 4.69 3.87 - 5.11 MIL/uL   Hemoglobin 12.1 12.0 - 15.0 g/dL   HCT 37.0 36.0 - 46.0 %   MCV 78.9 78.0 - 100.0 fL   MCH 25.8 (L) 26.0 - 34.0 pg   MCHC 32.7 30.0 - 36.0 g/dL   RDW 14.8 11.5 - 15.5 %   Platelets 238 150 - 400 K/uL  POC CBG, ED     Status: Abnormal   Collection Time: 06/17/16  8:57 AM  Result Value Ref Range   Glucose-Capillary 187 (H) 65 - 99 mg/dL   Ct Abdomen Pelvis W Contrast  Result Date: 06/17/2016 CLINICAL DATA:  Upper abdominal pain, vomiting EXAM: CT ABDOMEN AND PELVIS WITH CONTRAST TECHNIQUE: Multidetector CT imaging of the abdomen and pelvis was performed using the standard protocol following bolus administration of intravenous contrast. CONTRAST:  11m ISOVUE-300 IOPAMIDOL (ISOVUE-300) INJECTION 61% COMPARISON:  None. FINDINGS: Lower chest: Areas of atelectasis in the lung bases bilaterally. No effusions. Hepatobiliary: 2.3 cm gallstone within the gallbladder. No focal hepatic abnormality. Pancreas: No focal abnormality or ductal dilatation. Spleen: No focal abnormality.  Normal size. Adrenals/Urinary Tract: No adrenal abnormality. No focal renal abnormality. No stones or hydronephrosis. Urinary bladder is unremarkable. Stomach/Bowel: Appendix is visualized and is normal. Stomach, large and small bowel grossly unremarkable. Vascular/Lymphatic: No evidence of aneurysm or adenopathy. Scattered aortic and iliac calcifications. Reproductive: Prior hysterectomy.  No adnexal mass. Other: No free fluid or free air. Musculoskeletal: No acute bony abnormality or focal bone lesion. IMPRESSION: Cholelithiasis. Bibasilar atelectasis. Normal appendix. No acute findings in the abdomen or pelvis. Electronically Signed   By: KRolm BaptiseM.D.   On: 06/17/2016 09:56      Assessment/Plan RUQ abdominal pain - CT today showed cholelithiasis without definite evidence of cholecystitis.  RUQ ultrasound also showed cholelithiasis (2.6cm  stone near the neck) without other ultrasound evidence of cholecystitis or biliary obstruction.  Normal LFTs and lipase, mild leukocytosis. - likely has symptomatic cholelithiasis - admit to med-surg - NPO after midnight, IVF, pain control, antiemetics, antibiotics (rocephin)  DM - SSI HTN - hold losartan, hydralazine PRN HLD - pravastatin Obesity  ID - rocephin day 1 FEN - clears then NPO after midnight, IVF VTE - lovenox, SCD's  Plan - will discuss with MD, likely plan for cholecystectomy tomorrow.   BJerrye Beavers [facility] 06/17/2016, 1:43 PM Pager: [phone]: [phone]-Fri 7:00 am-4:30 pm Sat-Sun 7:00 am-11:30 am

## 2016-06-18 ENCOUNTER — Encounter (HOSPITAL_COMMUNITY): Payer: Self-pay

## 2016-06-18 ENCOUNTER — Observation Stay (HOSPITAL_COMMUNITY): Payer: BLUE CROSS/BLUE SHIELD | Admitting: Anesthesiology

## 2016-06-18 ENCOUNTER — Encounter (HOSPITAL_COMMUNITY)
Admission: EM | Disposition: A | Discharge status: Home / Self Care | Payer: Self-pay | Source: Home / Self Care | Attending: Emergency Medicine

## 2016-06-18 HISTORY — PX: CHOLECYSTECTOMY: SHX55

## 2016-06-18 LAB — GLUCOSE, CAPILLARY
GLUCOSE-CAPILLARY: 113 mg/dL — AB (ref 65–99)
GLUCOSE-CAPILLARY: 134 mg/dL — AB (ref 65–99)
Glucose-Capillary: 106 mg/dL — ABNORMAL HIGH (ref 65–99)
Glucose-Capillary: 165 mg/dL — ABNORMAL HIGH (ref 65–99)
Glucose-Capillary: 138 mg/dL — ABNORMAL HIGH (ref 65–99)
Glucose-Capillary: 169 mg/dL — ABNORMAL HIGH (ref 65–99)

## 2016-06-18 SURGERY — LAPAROSCOPIC CHOLECYSTECTOMY WITH INTRAOPERATIVE CHOLANGIOGRAM
Anesthesia: General | Site: Abdomen

## 2016-06-18 MED ORDER — ROCURONIUM BROMIDE 10 MG/ML (PF) SYRINGE
PREFILLED_SYRINGE | INTRAVENOUS | Status: DC | PRN
  Administered 2016-06-18: 50 mg via INTRAVENOUS

## 2016-06-18 MED ORDER — PROPOFOL 10 MG/ML IV BOLUS
INTRAVENOUS | Status: AC
  Filled 2016-06-18: qty 20

## 2016-06-18 MED ORDER — SUGAMMADEX SODIUM 500 MG/5ML IV SOLN
INTRAVENOUS | Status: AC
  Filled 2016-06-18: qty 5

## 2016-06-18 MED ORDER — LOSARTAN POTASSIUM 50 MG PO TABS
50.0000 mg | ORAL_TABLET | Freq: Every day | ORAL | Status: DC
  Administered 2016-06-19: 50 mg via ORAL
  Filled 2016-06-18: qty 1

## 2016-06-18 MED ORDER — FENTANYL CITRATE (PF) 100 MCG/2ML IJ SOLN
INTRAMUSCULAR | Status: AC
  Administered 2016-06-18: 50 ug via INTRAVENOUS
  Filled 2016-06-18: qty 2

## 2016-06-18 MED ORDER — ROCURONIUM BROMIDE 10 MG/ML (PF) SYRINGE
PREFILLED_SYRINGE | INTRAVENOUS | Status: AC
  Filled 2016-06-18: qty 10

## 2016-06-18 MED ORDER — PROPOFOL 10 MG/ML IV BOLUS
INTRAVENOUS | Status: DC | PRN
  Administered 2016-06-18: 200 mg via INTRAVENOUS

## 2016-06-18 MED ORDER — MIDAZOLAM HCL 2 MG/2ML IJ SOLN
INTRAMUSCULAR | Status: AC
  Filled 2016-06-18: qty 2

## 2016-06-18 MED ORDER — FENTANYL CITRATE (PF) 100 MCG/2ML IJ SOLN
25.0000 ug | INTRAMUSCULAR | Status: DC | PRN
  Administered 2016-06-18 (×2): 50 ug via INTRAVENOUS

## 2016-06-18 MED ORDER — FENTANYL CITRATE (PF) 100 MCG/2ML IJ SOLN
INTRAMUSCULAR | Status: AC
  Filled 2016-06-18: qty 2

## 2016-06-18 MED ORDER — MEPERIDINE HCL 50 MG/ML IJ SOLN: 6.2500 mg | INTRAMUSCULAR | Status: DC | PRN

## 2016-06-18 MED ORDER — LIDOCAINE 2% (20 MG/ML) 5 ML SYRINGE
INTRAMUSCULAR | Status: DC | PRN
  Administered 2016-06-18: 100 mg via INTRAVENOUS

## 2016-06-18 MED ORDER — ONDANSETRON HCL 4 MG/2ML IJ SOLN
INTRAMUSCULAR | Status: AC
  Filled 2016-06-18: qty 2

## 2016-06-18 MED ORDER — LACTATED RINGERS IV SOLN
INTRAVENOUS | Status: DC
  Administered 2016-06-18: 12:00:00 via INTRAVENOUS

## 2016-06-18 MED ORDER — LACTATED RINGERS IR SOLN
Status: DC | PRN
  Administered 2016-06-18: 1000 mL

## 2016-06-18 MED ORDER — LACTATED RINGERS IV SOLN
INTRAVENOUS | Status: DC
  Administered 2016-06-18: 1000 mL via INTRAVENOUS

## 2016-06-18 MED ORDER — IOPAMIDOL (ISOVUE-300) INJECTION 61%
INTRAVENOUS | Status: AC
  Filled 2016-06-18: qty 50

## 2016-06-18 MED ORDER — EPHEDRINE 5 MG/ML INJ
INTRAVENOUS | Status: AC
  Filled 2016-06-18: qty 10

## 2016-06-18 MED ORDER — EPHEDRINE SULFATE 50 MG/ML IJ SOLN
INTRAMUSCULAR | Status: DC | PRN
  Administered 2016-06-18: 10 mg via INTRAVENOUS

## 2016-06-18 MED ORDER — SUCCINYLCHOLINE CHLORIDE 200 MG/10ML IV SOSY
PREFILLED_SYRINGE | INTRAVENOUS | Status: DC | PRN
  Administered 2016-06-18: 100 mg via INTRAVENOUS

## 2016-06-18 MED ORDER — DEXAMETHASONE SODIUM PHOSPHATE 10 MG/ML IJ SOLN
INTRAMUSCULAR | Status: AC
  Filled 2016-06-18: qty 1

## 2016-06-18 MED ORDER — FENTANYL CITRATE (PF) 100 MCG/2ML IJ SOLN
50.0000 ug | INTRAMUSCULAR | Status: DC | PRN
  Administered 2016-06-18 (×2): 50 ug via INTRAVENOUS

## 2016-06-18 MED ORDER — GABAPENTIN 300 MG PO CAPS: 300.0000 mg | ORAL_CAPSULE | Freq: Three times a day (TID) | ORAL | Status: DC | PRN

## 2016-06-18 MED ORDER — ONDANSETRON HCL 4 MG/2ML IJ SOLN
INTRAMUSCULAR | Status: DC | PRN
  Administered 2016-06-18: 4 mg via INTRAVENOUS

## 2016-06-18 MED ORDER — DEXAMETHASONE SODIUM PHOSPHATE 10 MG/ML IJ SOLN
INTRAMUSCULAR | Status: DC | PRN
  Administered 2016-06-18: 10 mg via INTRAVENOUS

## 2016-06-18 MED ORDER — 0.9 % SODIUM CHLORIDE (POUR BTL) OPTIME
TOPICAL | Status: DC | PRN
  Administered 2016-06-18: 1000 mL

## 2016-06-18 MED ORDER — FENTANYL CITRATE (PF) 100 MCG/2ML IJ SOLN
INTRAMUSCULAR | Status: DC | PRN
  Administered 2016-06-18: 100 ug via INTRAVENOUS

## 2016-06-18 MED ORDER — BUPIVACAINE-EPINEPHRINE 0.25% -1:200000 IJ SOLN
INTRAMUSCULAR | Status: DC | PRN
  Administered 2016-06-18: 15 mL

## 2016-06-18 MED ORDER — METOCLOPRAMIDE HCL 5 MG/ML IJ SOLN: 10.0000 mg | Freq: Once | INTRAMUSCULAR | Status: DC | PRN

## 2016-06-18 MED ORDER — CEFAZOLIN SODIUM-DEXTROSE 2-4 GM/100ML-% IV SOLN
INTRAVENOUS | Status: AC
Start: 2016-06-18 — End: 2016-06-18
  Filled 2016-06-18: qty 100

## 2016-06-18 MED ORDER — BUPIVACAINE-EPINEPHRINE (PF) 0.5% -1:200000 IJ SOLN
INTRAMUSCULAR | Status: AC
  Filled 2016-06-18: qty 30

## 2016-06-18 MED ORDER — SUGAMMADEX SODIUM 200 MG/2ML IV SOLN
INTRAVENOUS | Status: DC | PRN
  Administered 2016-06-18: 300 mg via INTRAVENOUS

## 2016-06-18 MED ORDER — CEFAZOLIN SODIUM-DEXTROSE 2-4 GM/100ML-% IV SOLN
2.0000 g | Freq: Once | INTRAVENOUS | Status: AC
  Administered 2016-06-18: 2 g via INTRAVENOUS
  Filled 2016-06-18: qty 100

## 2016-06-18 SURGICAL SUPPLY — 40 items
APPLIER CLIP 5 13 M/L LIGAMAX5 (MISCELLANEOUS) ×2
APPLIER CLIP ROT 10 11.4 M/L (STAPLE)
BENZOIN TINCTURE PRP APPL 2/3 (GAUZE/BANDAGES/DRESSINGS) IMPLANT
CABLE HIGH FREQUENCY MONO STRZ (ELECTRODE) ×2 IMPLANT
CLIP APPLIE 5 13 M/L LIGAMAX5 (MISCELLANEOUS) ×1 IMPLANT
CLIP APPLIE ROT 10 11.4 M/L (STAPLE) IMPLANT
COVER MAYO STAND STRL (DRAPES) IMPLANT
COVER SURGICAL LIGHT HANDLE (MISCELLANEOUS) ×2 IMPLANT
DECANTER SPIKE VIAL GLASS SM (MISCELLANEOUS) ×2 IMPLANT
DEVICE TROCAR PUNCTURE CLOSURE (ENDOMECHANICALS) ×2 IMPLANT
DRAPE C-ARM 42X120 X-RAY (DRAPES) IMPLANT
ELECT REM PT RETURN 9FT ADLT (ELECTROSURGICAL) ×2
ELECTRODE REM PT RTRN 9FT ADLT (ELECTROSURGICAL) ×1 IMPLANT
GLOVE BIO SURGEON STRL SZ7 (GLOVE) ×2 IMPLANT
GLOVE BIOGEL PI IND STRL 7.5 (GLOVE) ×1 IMPLANT
GLOVE BIOGEL PI INDICATOR 7.5 (GLOVE) ×1
GOWN STRL REUS W/TWL LRG LVL3 (GOWN DISPOSABLE) ×2 IMPLANT
GOWN STRL REUS W/TWL XL LVL3 (GOWN DISPOSABLE) ×4 IMPLANT
HEMOSTAT SNOW SURGICEL 2X4 (HEMOSTASIS) IMPLANT
IRRIG SUCT STRYKERFLOW 2 WTIP (MISCELLANEOUS) ×2
IRRIGATION SUCT STRKRFLW 2 WTP (MISCELLANEOUS) ×1 IMPLANT
KIT BASIN OR (CUSTOM PROCEDURE TRAY) ×2 IMPLANT
LIQUID BAND (GAUZE/BANDAGES/DRESSINGS) ×2 IMPLANT
POSITIONER SURGICAL ARM (MISCELLANEOUS) IMPLANT
POUCH RETRIEVAL ECOSAC 10 (ENDOMECHANICALS) ×1 IMPLANT
POUCH RETRIEVAL ECOSAC 10MM (ENDOMECHANICALS) ×1
SCISSORS LAP 5X35 DISP (ENDOMECHANICALS) ×2 IMPLANT
SET CHOLANGIOGRAPH MIX (MISCELLANEOUS) IMPLANT
SLEEVE XCEL OPT CAN 5 100 (ENDOMECHANICALS) ×4 IMPLANT
STRIP CLOSURE SKIN 1/2X4 (GAUZE/BANDAGES/DRESSINGS) ×2 IMPLANT
SUT MNCRL AB 4-0 PS2 18 (SUTURE) ×2 IMPLANT
SUT VICRYL 0 TIES 12 18 (SUTURE) IMPLANT
SUT VICRYL 0 UR6 27IN ABS (SUTURE) ×2 IMPLANT
TOWEL OR 17X26 10 PK STRL BLUE (TOWEL DISPOSABLE) ×2 IMPLANT
TOWEL OR NON WOVEN STRL DISP B (DISPOSABLE) ×2 IMPLANT
TRAY LAPAROSCOPIC (CUSTOM PROCEDURE TRAY) ×2 IMPLANT
TROCAR BLADELESS OPT 5 100 (ENDOMECHANICALS) ×2 IMPLANT
TROCAR XCEL BLUNT TIP 100MML (ENDOMECHANICALS) ×2 IMPLANT
TROCAR XCEL NON-BLD 11X100MML (ENDOMECHANICALS) IMPLANT
TUBING INSUF HEATED (TUBING) ×2 IMPLANT

## 2016-06-18 NOTE — Plan of Care (Signed)
Problem: Safety: Goal: Ability to remain free from injury will improve Outcome: Progressing Requires one assist with transfers.

## 2016-06-18 NOTE — Anesthesia Procedure Notes (Signed)
Procedure Name: Intubation Date/Time: 06/18/2016 10:03 AM Performed by: Leroy LibmanEARDON, Enslee Bibbins L Patient Re-evaluated:Patient Re-evaluated prior to inductionOxygen Delivery Method: Circle system utilized Preoxygenation: Pre-oxygenation with 100% oxygen Intubation Type: IV induction and Rapid sequence Laryngoscope Size: Miller and 2 Grade View: Grade I Tube type: Oral Tube size: 7.5 mm Number of attempts: 1 Airway Equipment and Method: Stylet Placement Confirmation: ETT inserted through vocal cords under direct vision,  positive ETCO2 and breath sounds checked- equal and bilateral Secured at: 22 cm Tube secured with: Tape Dental Injury: Teeth and Oropharynx as per pre-operative assessment

## 2016-06-18 NOTE — Anesthesia Preprocedure Evaluation (Signed)
Anesthesia Evaluation  Patient identified by MRN, date of birth, ID band Patient awake    Reviewed: Allergy & Precautions, NPO status , Patient's Chart, lab work & pertinent test results  Airway Mallampati: II  TM Distance: >3 FB Neck ROM: Full    Dental no notable dental hx.    Pulmonary neg pulmonary ROS,    Pulmonary exam normal breath sounds clear to auscultation       Cardiovascular hypertension, negative cardio ROS Normal cardiovascular exam Rhythm:Regular Rate:Normal     Neuro/Psych negative neurological ROS  negative psych ROS   GI/Hepatic negative GI ROS, Neg liver ROS,   Endo/Other  diabetesMorbid obesity  Renal/GU negative Renal ROS  negative genitourinary   Musculoskeletal negative musculoskeletal ROS (+)   Abdominal   Peds negative pediatric ROS (+)  Hematology negative hematology ROS (+)   Anesthesia Other Findings   Reproductive/Obstetrics negative OB ROS                             Anesthesia Physical Anesthesia Plan  ASA: III  Anesthesia Plan: General   Post-op Pain Management:    Induction: Intravenous  Airway Management Planned: Oral ETT  Additional Equipment:   Intra-op Plan:   Post-operative Plan: Extubation in OR  Informed Consent: I have reviewed the patients History and Physical, chart, labs and discussed the procedure including the risks, benefits and alternatives for the proposed anesthesia with the patient or authorized representative who has indicated his/her understanding and acceptance.   Dental advisory given  Plan Discussed with: CRNA  Anesthesia Plan Comments:         Anesthesia Quick Evaluation

## 2016-06-18 NOTE — Plan of Care (Signed)
Problem: Activity: Goal: Risk for activity intolerance will decrease Outcome: Progressing Patient was assisted to the BR with one assist. Patient tolerated the activity.

## 2016-06-18 NOTE — Progress Notes (Signed)
Subjective: ruq pain  Objective: Vital signs in last 24 hours: Temp:  [98 F (36.7 C)-99.2 F (37.3 C)] 99.1 F (37.3 C) (09/20 0544) Pulse Rate:  [65-100] 81 (09/20 0544) Resp:  [16-24] 16 (09/20 0544) BP: (115-167)/(44-78) 158/73 (09/20 0544) SpO2:  [94 %-100 %] 100 % (09/20 0544) Weight:  [108.4 kg (239 lb)] 108.4 kg (239 lb) (09/19 1723) Last BM Date: 06/16/16  Intake/Output from previous day: 09/19 0701 - 09/20 0700 In: 1315 [I.V.:1265; IV Piggyback:50] Out: -  Intake/Output this shift: No intake/output data recorded.  GI: ruq tender to exam  Lab Results:   Recent Labs  06/17/16 0825 06/17/16 1926  WBC 11.1* 14.7*  HGB 12.1 11.8*  HCT 37.0 37.0  PLT 238 187   BMET  Recent Labs  06/17/16 0825 06/17/16 1926  NA 138  --   K 3.7  --   CL 106  --   CO2 25  --   GLUCOSE 198*  --   BUN 14  --   CREATININE 0.77 0.79  CALCIUM 9.1  --    PT/INR No results for input(s): LABPROT, INR in the last 72 hours. ABG No results for input(s): PHART, HCO3 in the last 72 hours.  Invalid input(s): PCO2, PO2  Studies/Results: Ct Abdomen Pelvis W Contrast  Result Date: 06/17/2016 CLINICAL DATA:  Upper abdominal pain, vomiting EXAM: CT ABDOMEN AND PELVIS WITH CONTRAST TECHNIQUE: Multidetector CT imaging of the abdomen and pelvis was performed using the standard protocol following bolus administration of intravenous contrast. CONTRAST:  ISOVUE-300 IOPAMIDOL (ISOVUE-300) INJECTION 61% COMPARISON:  None. FINDINGS: Lower chest: Areas of atelectasis in the lung bases bilaterally. No effusions. Hepatobiliary: 2.3 cm gallstone within the gallbladder. No focal hepatic abnormality. Pancreas: No focal abnormality or ductal dilatation. Spleen: No focal abnormality.  Normal size. Adrenals/Urinary Tract: No adrenal abnormality. No focal renal abnormality. No stones or hydronephrosis. Urinary bladder is unremarkable. Stomach/Bowel: Appendix is visualized and is normal. Stomach,  large and small bowel grossly unremarkable. Vascular/Lymphatic: No evidence of aneurysm or adenopathy. Scattered aortic and iliac calcifications. Reproductive: Prior hysterectomy.  No adnexal mass. Other: No free fluid or free air. Musculoskeletal: No acute bony abnormality or focal bone lesion. IMPRESSION: Cholelithiasis. Bibasilar atelectasis. Normal appendix. No acute findings in the abdomen or pelvis. Electronically Signed   By: Charlett Nose M.D.   On: 06/17/2016 09:56   US Abdomen Limited Ruq  Result Date: 06/17/2016 CLINICAL DATA:  Abdominal pain x1 day.  Cholelithiasis on CT EXAM: US ABDOMEN LIMITED - RIGHT UPPER QUADRANT COMPARISON:  CT from earlier the same day FINDINGS: Gallbladder: Partially calcified stone near the neck measuring 2.6 cm diameter. No gallbladder wall thickening or pericholecystic fluid. Sonographer reports no sonographic Murphy's sign. Common bile duct: Diameter: 3.1 mm, unremarkable Liver: No focal lesion identified. Within normal limits in parenchymal echogenicity. IMPRESSION: 1. Cholelithiasis without other ultrasound evidence of cholecystitis or biliary obstruction. Electronically Signed   By: Corlis Leak M.D.   On: 06/17/2016 13:44    Anti-infectives: Anti-infectives    Start     Dose/Rate Route Frequency Ordered Stop   06/18/16 1415  cefTRIAXone (ROCEPHIN) 2 g in dextrose 5 % 50 mL IVPB     2 g 100 mL/hr over 30 Minutes Intravenous Every 24 hours 06/17/16 1716     06/17/16 1400  cefTRIAXone (ROCEPHIN) 2 g in dextrose 5 % 50 mL IVPB     2 g 100 mL/hr over 30 Minutes Intravenous  Once 06/17/16 1358 06/17/16 1440  Assessment/Plan: Cholecystitis  Lap chole today  Laiklynn Raczynski 06/18/2016

## 2016-06-18 NOTE — Progress Notes (Signed)
Patient from PACU,s/p lap cholecystectomy. Lethargic,responds to voice. Denies pain. Dressings intact to abdomen. Vital signs obtained. Patient in bed. SCD's inplace. Will continue to monitor.

## 2016-06-18 NOTE — Discharge Instructions (Signed)
[facility] LAPAROSCOPIC SURGERY: POST OP INSTRUCTIONS  Always review your discharge instruction sheet given to you by the facility where your surgery was performed. IF YOU HAVE DISABILITY OR FAMILY LEAVE FORMS, YOU MUST BRING THEM TO THE OFFICE FOR PROCESSING.   DO NOT GIVE THEM TO YOUR DOCTOR.  1. A prescription for pain medication may be given to you upon discharge.  Take your pain medication as prescribed, if needed.  If narcotic pain medicine is not needed, then you may take acetaminophen (Tylenol), naprosyn (Alleve), or ibuprofen (Advil) as needed. 2. Take your usually prescribed medications unless otherwise directed. 3. If you need a refill on your pain medication, please contact your pharmacy.  They will contact our office to request authorization. Prescriptions will not be filled after 5pm or on week-ends. 4. You should follow a light diet the first few days after arrival home, such as soup and crackers, etc.  Be sure to include lots of fluids daily. 5. Most patients will experience some swelling and bruising in the area of the incisions.  Ice packs will help.  Swelling and bruising can take several days to resolve.  6. It is common to experience some constipation if taking pain medication after surgery.  Increasing fluid intake and taking a stool softener (such as Colace) will usually help or prevent this problem from occurring.  A mild laxative (Milk of Magnesia or Miralax) should be taken according to package instructions if there are no bowel movements after 48 hours. 7. Unless discharge instructions indicate otherwise, you may remove your bandages 48 hours after surgery, and you may shower at that time.  You may have steri-strips (small skin tapes) in place directly over the incision.  These strips should be left on the skin for 7-10 days.  If your surgeon used skin glue on the incision, you may shower in 24 hours.  The glue will flake  off over the next 2-3 weeks.  Any sutures or staples will be removed at the office during your follow-up visit. 8. ACTIVITIES:  You may resume regular (light) daily activities beginning the next day--such as daily self-care, walking, climbing stairs--gradually increasing activities as tolerated.  You may have sexual intercourse when it is comfortable.  Refrain from any heavy lifting or straining until approved by your doctor. a. You may drive when you are no longer taking prescription pain medication, you can comfortably wear a seatbelt, and you can safely maneuver your car and apply brakes. b. RETURN TO WORK:  __________________________________________________________ 9. You should see your doctor in the office for a follow-up appointment approximately 2-3 weeks after your surgery.  Make sure that you call for this appointment within a day or two after you arrive home to insure a convenient appointment time. 10. OTHER INSTRUCTIONS: __________________________________________________________________________________________________________________________ __________________________________________________________________________________________________________________________ WHEN TO CALL YOUR DOCTOR: 1. Fever over 101.0 2. Inability to urinate 3. Continued bleeding from incision. 4. Increased pain, redness, or drainage from the incision. 5. Increasing abdominal pain  The clinic staff is available to answer your questions during regular business hours.  Please don't hesitate to call and ask to speak to one of the nurses for clinical concerns.  If you have a medical emergency, go to the nearest emergency room or call 911.  A surgeon from [facility] is always on call at the hospital. [address], [address], [place], [place]  [address] ? [address] [address], [place], [place]   [address] [phone] ? [phone] ? [phone]   [phone] Web site: [email or web address]  

## 2016-06-18 NOTE — Transfer of Care (Signed)
Immediate Anesthesia Transfer of Care Note  Patient: Samantha Santiago  Procedure(s) Performed: Procedure(s): LAPAROSCOPIC CHOLECYSTECTOMY (N/A)  Patient Location: PACU  Anesthesia Type:General  Level of Consciousness: awake, alert  and oriented  Airway & Oxygen Therapy: Patient Spontanous Breathing and Patient connected to face mask oxygen  Post-op Assessment: Report given to RN and Post -op Vital signs reviewed and stable  Post vital signs: Reviewed and stable  Last Vitals:  Vitals:   06/18/16 0544 06/18/16 0819  BP: (!) 158/73 (!) 152/79  Pulse: 81 88  Resp: 16 18  Temp: 37.3 C 36.9 C    Last Pain:  Vitals:   06/18/16 0952  TempSrc:   PainSc: 3       Patients Stated Pain Goal: 2 (06/18/16 [id number])  Complications: No apparent anesthesia complications

## 2016-06-18 NOTE — Op Note (Signed)
Preoperative diagnosis: Acute cholecystitis Postoperative diagnosis: same as above Procedure: laparoscopic cholecystectomy Surgeon: Dr Harden MoMatt Kileen Lange Anesthesia: general EBL: minimal Drains none Specimen gb and contents to pathology Complications: none Sponge count correct at completion Disposition to recovery stable Indications: This is a [age] yof with acute cholecystitis.  We discussed a laparoscopic cholecystectomy.  Procedure: After informed consent was obtained the patient was taken to the operating room. She was given antibiotics. Sequential compression devices were on her legs. She was placed under general anesthesia without complication. Her abdomen was prepped and draped in the standard sterile surgical fashion. A surgical timeout was then performed.  I infiltrated marcaine below the umbilicus. I made a vertical incision and grasped the fascia. I then incised the fascia and entered the peritoneum bluntly. She had a lot of scarring at that site from prior surgery.  I placed a 0 vicryl pursestring suture and inserted a hasson trocar. I then insufflated the abdomen to 15 mm Hg pressure.I then placed 3 additional 5 mm trocars in the epigastrium and the right upper quadrant. she had adhesions from prior low midline. I did take these down from abdominal wall. It was only omentum.   I then was able to grasp the gallbladder and retract it cephalad and lateral. She had a lot of inflammation in the triangle and clearly had acute cholecystitis as well. I had to decompress the gallbladder just to grasp it. I then obtained the critical view of safety. I clipped and divided the cystic artery but did so with two sets of clips treating the anterior and posterior branches of the artery.I treated the cystic duct in a similar fashion. The clips traversed the duct. The duct appeared viable. I then removed the gallbladder from the liver bed and placed it in a bag.  I then obtained hemostasis and  irrigated.I removed the hasson trocar and tied the pursestring down. I placed an additional two 0 vicryl stitches at the umbilical incision with the endoclose device.  I then desufflated the abdomen and removed all my remaining trocars. I then closed these with 4-0 Monocryl and Dermabond. She tolerated this well was extubated and transferred to the recovery room in stable condition

## 2016-06-18 NOTE — Anesthesia Postprocedure Evaluation (Signed)
Anesthesia Post Note  Patient: Samantha Santiago  Procedure(s) Performed: Procedure(s) (LRB): LAPAROSCOPIC CHOLECYSTECTOMY (N/A)  Patient location during evaluation: PACU Anesthesia Type: General Level of consciousness: awake and alert Pain management: pain level controlled Vital Signs Assessment: post-procedure vital signs reviewed and stable Respiratory status: spontaneous breathing, nonlabored ventilation, respiratory function stable and patient connected to nasal cannula oxygen Cardiovascular status: blood pressure returned to baseline and stable Postop Assessment: no signs of nausea or vomiting Anesthetic complications: no    Last Vitals:  Vitals:   06/18/16 1328 06/18/16 1432  BP: (!) 161/72 (!) 154/83  Pulse: 98 (!) 102  Resp: 16 16  Temp: 37.4 C 37.1 C    Last Pain:  Vitals:   06/18/16 1432  TempSrc: Oral  PainSc:                  Phillips Groutarignan, Japheth Diekman

## 2016-06-19 LAB — GLUCOSE, CAPILLARY
GLUCOSE-CAPILLARY: 102 mg/dL — AB (ref 65–99)
GLUCOSE-CAPILLARY: 110 mg/dL — AB (ref 65–99)
GLUCOSE-CAPILLARY: 115 mg/dL — AB (ref 65–99)
Glucose-Capillary: 107 mg/dL — ABNORMAL HIGH (ref 65–99)
Glucose-Capillary: 108 mg/dL — ABNORMAL HIGH (ref 65–99)
Glucose-Capillary: 95 mg/dL (ref 65–99)

## 2016-06-19 MED ORDER — INSULIN ASPART 100 UNIT/ML ~~LOC~~ SOLN: 0.0000 [IU] | Freq: Three times a day (TID) | SUBCUTANEOUS | Status: DC

## 2016-06-19 MED ORDER — OXYCODONE HCL 5 MG PO TABS
5.0000 mg | ORAL_TABLET | ORAL | 0 refills | Status: AC | PRN
Start: 2016-06-19 — End: ?

## 2016-06-19 MED ORDER — PHENOL 1.4 % MT LIQD
1.0000 | OROMUCOSAL | Status: DC | PRN
  Filled 2016-06-19 (×2): qty 177

## 2016-06-19 MED ORDER — MORPHINE SULFATE (PF) 2 MG/ML IV SOLN: 1.0000 mg | Freq: Four times a day (QID) | INTRAVENOUS | Status: DC | PRN

## 2016-06-19 NOTE — Discharge Summary (Signed)
  [facility] Discharge Summary   Patient ID: Samantha Santiago MRN: [id number] DOB/AGE: 58/10/1955 58 y.o.  Admit date: 06/17/2016 Discharge date: 06/20/2016  Admitting Diagnosis: Calculus of gallbladder Upper abdominal pain  Discharge Diagnosis Patient Active Problem List   Diagnosis Date Noted  . Symptomatic cholelithiasis 06/17/2016    Consultants None  Imaging: CT abdomen pelvis w contrast 06/17/16: Cholelithiasis. Bibasilar atelectasis. Normal appendix. No acute findings in the abdomen or pelvis.  US abdomen pelvis w contrast 06/17/16: 1. Cholelithiasis without other ultrasound evidence of cholecystitis or biliary obstruction. 2.6 cm stone in neck  Procedures Dr. Dwain SarnaWakefield (06/18/16) - Laparoscopic Cholecystectomy  Hospital Course:  Samantha Santiago is a 60yo female PMH HTN, DM, HLD, obesity who [facility] with acute onset upper abdominal pain with associated nausea and vomiting.  Workup showed cholelithiasis with a 2.6cm gallstone in the neck of the gallbladder. She had normal LFTs and lipase, and mild leukocytosis. Patient was admitted and underwent procedure listed above.  Tolerated procedure well and was transferred to the floor. POD1 she complained of persistent abdominal pain and bloating. On POD2 the patient was voiding well, tolerating diet, ambulating well, pain well controlled, vital signs stable, incisions c/d/i and felt stable for discharge home.  Patient will follow up in our office in 3 weeks and knows to call with questions or concerns.    Physical Exam: Gen:  Alert, NAD, pleasant Pulm: CTAB Abd: Soft, ND, +BS, appropriately tender, multiple laparoscopic incisions covered with steri strips C/D/I    Medication List    TAKE these medications   aspirin EC 81 MG tablet Take 81 mg by mouth daily.   gabapentin 300 MG capsule Commonly known as:  NEURONTIN Take 300 mg by mouth 3 (three) times daily as needed (for pain).   losartan  50 MG tablet Commonly known as:  COZAAR Take 50 mg by mouth daily.   lovastatin 20 MG tablet Commonly known as:  MEVACOR Take 20 mg by mouth daily with breakfast.   oxyCODONE 5 MG immediate release tablet Commonly known as:  Oxy IR/ROXICODONE Take 1 tablet (5 mg total) by mouth every 4 (four) hours as needed for moderate pain.   vitamin B-12 1000 MCG tablet Commonly known as:  CYANOCOBALAMIN Take 1,000 mcg by mouth daily.   Vitamin D (Ergocalciferol) 50000 units Caps capsule Commonly known as:  DRISDOL Take 50,000 Units by mouth every 7 (seven) days. Takes on Monday or Friday        Follow-up Information    WAKEFIELD,MATTHEW, MD. Go on 07/10/2016.   Specialty:  General Surgery Why:  Your appointment is 07/16/2016 at 11:20am. Please arrive 30 minutes prior to your scheduled appointment time to fill out necessary paperwork. Contact information: [address] [place] [address] [phone]           Signed: Edson SnowballBROOKE A MILLER, [facility] 06/19/2016, 1:39 PM Pager: [phone] Consults: [phone] Mon-Fri 7:00 am-4:30 pm Sat-Sun 7:00 am-11:30 am

## 2016-06-19 NOTE — Progress Notes (Signed)
Patient ID: Samantha Santiago, female   DOB: 10/14/1955, 60 y.o.   MRN: [id number]  [facility]  1 Day Post-Op  Subjective: Some continued abdominal pain, better than yesterday. Has not eaten very much but is tolerating liquids. No BM or flatus. Has not ambulated very much. States that she is concerned about going home because of her pain and SOB; she reports some SOB on exertion, no CP, no cough.  Objective: Vital signs in last 24 hours: Temp:  [98.8 F (37.1 C)-100.3 F (37.9 C)] 98.8 F (37.1 C) (09/21 0525) Pulse Rate:  [91-113] 91 (09/21 0525) Resp:  [12-20] 16 (09/21 0525) BP: (132-165)/(61-108) 165/82 (09/21 0525) SpO2:  [93 %-100 %] 100 % (09/21 0525) Last BM Date: 06/17/16  Intake/Output from previous day: 09/20 0701 - 09/21 0700 In: 1665 [P.O.:715; I.V.:900; IV Piggyback:50] Out: 10 [Blood:10] Intake/Output this shift: No intake/output data recorded.  PE: Gen:  Alert, NAD, pleasant Pulm: CTAB Abd: Soft, ND, +BS, appropriately tender, multiple laparoscopic incisions covered with steri strips C/D/I  Lab Results:   Recent Labs  06/17/16 0825 06/17/16 1926  WBC 11.1* 14.7*  HGB 12.1 11.8*  HCT 37.0 37.0  PLT 238 187   BMET  Recent Labs  06/17/16 0825 06/17/16 1926  NA 138  --   K 3.7  --   CL 106  --   CO2 25  --   GLUCOSE 198*  --   BUN 14  --   CREATININE 0.77 0.79  CALCIUM 9.1  --    PT/INR No results for input(s): LABPROT, INR in the last 72 hours. CMP     Component Value Date/Time   NA 138 06/17/2016 0825   K 3.7 06/17/2016 0825   CL 106 06/17/2016 0825   CO2 25 06/17/2016 0825   GLUCOSE 198 (H) 06/17/2016 0825   BUN 14 06/17/2016 0825   CREATININE 0.79 06/17/2016 1926   CALCIUM 9.1 06/17/2016 0825   PROT 7.2 06/17/2016 0825   ALBUMIN 4.0 06/17/2016 0825   AST 25 06/17/2016 0825   ALT 24 06/17/2016 0825   ALKPHOS 72 06/17/2016 0825   BILITOT 0.2 (L) 06/17/2016 0825   GFRNONAA >60 06/17/2016 1926   GFRAA  >60 06/17/2016 1926   Lipase     Component Value Date/Time   LIPASE 28 06/17/2016 0825       Studies/Results: Ct Abdomen Pelvis W Contrast  Result Date: 06/17/2016 CLINICAL DATA:  Upper abdominal pain, vomiting EXAM: CT ABDOMEN AND PELVIS WITH CONTRAST TECHNIQUE: Multidetector CT imaging of the abdomen and pelvis was performed using the standard protocol following bolus administration of intravenous contrast. CONTRAST:  100mL ISOVUE-300 IOPAMIDOL (ISOVUE-300) INJECTION 61% COMPARISON:  None. FINDINGS: Lower chest: Areas of atelectasis in the lung bases bilaterally. No effusions. Hepatobiliary: 2.3 cm gallstone within the gallbladder. No focal hepatic abnormality. Pancreas: No focal abnormality or ductal dilatation. Spleen: No focal abnormality.  Normal size. Adrenals/Urinary Tract: No adrenal abnormality. No focal renal abnormality. No stones or hydronephrosis. Urinary bladder is unremarkable. Stomach/Bowel: Appendix is visualized and is normal. Stomach, large and small bowel grossly unremarkable. Vascular/Lymphatic: No evidence of aneurysm or adenopathy. Scattered aortic and iliac calcifications. Reproductive: Prior hysterectomy.  No adnexal mass. Other: No free fluid or free air. Musculoskeletal: No acute bony abnormality or focal bone lesion. IMPRESSION: Cholelithiasis. Bibasilar atelectasis. Normal appendix. No acute findings in the abdomen or pelvis. Electronically Signed   By: Charlett NoseKevin  Dover M.D.   On: 06/17/2016 09:56   [place] Abdomen  Limited Ruq  Result Date: 06/17/2016 CLINICAL DATA:  Abdominal pain x1 day.  Cholelithiasis on CT EXAM: US ABDOMEN LIMITED - RIGHT UPPER QUADRANT COMPARISON:  CT from earlier the same day FINDINGS: Gallbladder: Partially calcified stone near the neck measuring 2.6 cm diameter. No gallbladder wall thickening or pericholecystic fluid. Sonographer reports no sonographic Murphy's sign. Common bile duct: Diameter: 3.1 mm, unremarkable Liver: No focal lesion identified.  Within normal limits in parenchymal echogenicity. IMPRESSION: 1. Cholelithiasis without other ultrasound evidence of cholecystitis or biliary obstruction. Electronically Signed   By: Corlis Leak M.D.   On: 06/17/2016 13:44    Anti-infectives: Anti-infectives    Start     Dose/Rate Route Frequency Ordered Stop   06/18/16 1415  cefTRIAXone (ROCEPHIN) 2 g in dextrose 5 % 50 mL IVPB     2 g 100 mL/hr over 30 Minutes Intravenous Every 24 hours 06/17/16 1716     06/18/16 0930  ceFAZolin (ANCEF) IVPB 2g/100 mL premix     2 g 200 mL/hr over 30 Minutes Intravenous  Once 06/18/16 0917 06/18/16 1008   06/17/16 1400  cefTRIAXone (ROCEPHIN) 2 g in dextrose 5 % 50 mL IVPB     2 g 100 mL/hr over 30 Minutes Intravenous  Once 06/17/16 1358 06/17/16 1440       Assessment/Plan S/p laparoscopic cholecystectomy 06/18/16 Dr. Dwain Sarna - POD 1 - incisions cdi - some persistent abdominal pain. No flatus or BM - decreased appetite but is tolerating liquids  DM  HTN  HLD  Obesity  ID - rocephin day 3 VTE - lovenox FEN - carb modified  Plan - mobilize, increase fluids. Encouraged IS. Will recheck later today for possible discharge.   LOS: 0 days    Edson Snowball , [facility] 06/19/2016, 8:47 AM Pager: [phone] Consults: [phone] Mon-Fri 7:00 am-4:30 pm Sat-Sun 7:00 am-11:30 am

## 2016-06-20 LAB — GLUCOSE, CAPILLARY: Glucose-Capillary: 99 mg/dL (ref 65–99)

## 2016-06-20 MED ORDER — PROMETHAZINE HCL 12.5 MG PO TABS: 12.5000 mg | ORAL_TABLET | Freq: Four times a day (QID) | ORAL | 3 refills | Status: AC | PRN

## 2016-06-20 NOTE — Discharge Summary (Signed)
Physician Discharge Summary  Patient ID: Samantha Santiago MRN: [id number] DOB/AGE: 58/10/1955 60 y.o.  Admit date: 06/17/2016 Discharge date: 06/20/2016  Admission Diagnoses: Cholecystitis  Discharge Diagnoses:  Active Problems:   Symptomatic cholelithiasis   Discharged Condition: good  Hospital Course: [age] yof who was admitted with acute cholecystitis and underwent laparoscopic cholecystectomy.  She has done well after that and will be discharged home today.  Consults: None  Significant Diagnostic Studies: none  Treatments: surgery: laparoscopic cholecystectomy   Disposition: 01-Home or Self Care     Medication List    TAKE these medications   aspirin EC 81 MG tablet Take 81 mg by mouth daily.   gabapentin 300 MG capsule Commonly known as:  NEURONTIN Take 300 mg by mouth 3 (three) times daily as needed (for pain).   losartan 50 MG tablet Commonly known as:  COZAAR Take 50 mg by mouth daily.   lovastatin 20 MG tablet Commonly known as:  MEVACOR Take 20 mg by mouth daily with breakfast.   oxyCODONE 5 MG immediate release tablet Commonly known as:  Oxy IR/ROXICODONE Take 1 tablet (5 mg total) by mouth every 4 (four) hours as needed for moderate pain.   promethazine 12.5 MG tablet Commonly known as:  PHENERGAN Take 1 tablet (12.5 mg total) by mouth every 6 (six) hours as needed for nausea.   vitamin B-12 1000 MCG tablet Commonly known as:  CYANOCOBALAMIN Take 1,000 mcg by mouth daily.   Vitamin D (Ergocalciferol) 50000 units Caps capsule Commonly known as:  DRISDOL Take 50,000 Units by mouth every 7 (seven) days. Takes on Monday or Friday      Follow-up Information    Maddax Palinkas, MD. Go on 07/10/2016.   Specialty:  General Surgery Why:  Your appointment is 07/16/2016 at 11:20am. Please arrive 30 minutes prior to your scheduled appointment time to fill out necessary paperwork. Contact information: [address] [place]  [id number] [phone]           Signed: Emelia LoronWAKEFIELD,Alistar Mcenery 06/20/2016, 1:01 PM

## 2016-06-20 NOTE — Progress Notes (Signed)
Discharge instructions reviewed with patient, questions answered, verbalized understanding.  Rx given for pain medication.  Patient transported to front of hospital via wheelchair to be taken home by friend.

## 2016-06-20 NOTE — Progress Notes (Signed)
2 Days Post-Op  Subjective: Feels better, no n/v tol diet, passing flatus pain controlled, not walking much  Objective: Vital signs in last 24 hours: Temp:  [98.4 F (36.9 C)-99.3 F (37.4 C)] 98.8 F (37.1 C) (09/22 0503) Pulse Rate:  [74-92] 74 (09/22 0503) Resp:  [16-18] 18 (09/22 0503) BP: (120-167)/(59-84) 167/79 (09/22 0503) SpO2:  [94 %-100 %] 99 % (09/22 0503) Last BM Date: 06/17/16  Intake/Output from previous day: 09/21 0701 - 09/22 0700 In: 1140 [P.O.:1140] Out: -  Intake/Output this shift: No intake/output data recorded.  GI: soft not distended approp tender bs present  Lab Results:   Recent Labs  06/17/16 1926  WBC 14.7*  HGB 11.8*  HCT 37.0  PLT 187   BMET  Recent Labs  06/17/16 1926  CREATININE 0.79   PT/INR No results for input(s): LABPROT, INR in the last 72 hours. ABG No results for input(s): PHART, HCO3 in the last 72 hours.  Invalid input(s): PCO2, PO2  Studies/Results: No results found.  Anti-infectives: Anti-infectives    Start     Dose/Rate Route Frequency Ordered Stop   06/18/16 1415  cefTRIAXone (ROCEPHIN) 2 g in dextrose 5 % 50 mL IVPB  Status:  Discontinued     2 g 100 mL/hr over 30 Minutes Intravenous Every 24 hours 06/17/16 1716 06/20/16 0827   06/18/16 0930  ceFAZolin (ANCEF) IVPB 2g/100 mL premix     2 g 200 mL/hr over 30 Minutes Intravenous  Once 06/18/16 0917 06/18/16 1008   06/17/16 1400  cefTRIAXone (ROCEPHIN) 2 g in dextrose 5 % 50 mL IVPB     2 g 100 mL/hr over 30 Minutes Intravenous  Once 06/17/16 1358 06/17/16 1440      Assessment/Plan: POD 2 lap chole  Dc home today  [facility],[place] Wingard 06/20/2016

## 2018-03-13 ENCOUNTER — Other Ambulatory Visit: Payer: Self-pay

## 2018-03-13 ENCOUNTER — Emergency Department (HOSPITAL_BASED_OUTPATIENT_CLINIC_OR_DEPARTMENT_OTHER): Payer: BLUE CROSS/BLUE SHIELD

## 2018-03-13 ENCOUNTER — Encounter (HOSPITAL_BASED_OUTPATIENT_CLINIC_OR_DEPARTMENT_OTHER): Payer: Self-pay | Admitting: Emergency Medicine

## 2018-03-13 ENCOUNTER — Observation Stay (HOSPITAL_BASED_OUTPATIENT_CLINIC_OR_DEPARTMENT_OTHER)
Admission: EM | Admit: 2018-03-13 | Discharge: 2018-03-14 | Disposition: A | Discharge status: Home / Self Care | Payer: BLUE CROSS/BLUE SHIELD | Attending: Internal Medicine | Admitting: Internal Medicine

## 2018-03-13 DIAGNOSIS — I10 Essential (primary) hypertension: Secondary | ICD-10-CM | POA: Diagnosis present

## 2018-03-13 DIAGNOSIS — E119 Type 2 diabetes mellitus without complications: Secondary | ICD-10-CM | POA: Diagnosis not present

## 2018-03-13 DIAGNOSIS — Z79899 Other long term (current) drug therapy: Secondary | ICD-10-CM | POA: Insufficient documentation

## 2018-03-13 DIAGNOSIS — R0789 Other chest pain: Secondary | ICD-10-CM | POA: Diagnosis present

## 2018-03-13 DIAGNOSIS — E7800 Pure hypercholesterolemia, unspecified: Secondary | ICD-10-CM | POA: Diagnosis not present

## 2018-03-13 DIAGNOSIS — R072 Precordial pain: Principal | ICD-10-CM | POA: Insufficient documentation

## 2018-03-13 DIAGNOSIS — Z7982 Long term (current) use of aspirin: Secondary | ICD-10-CM | POA: Diagnosis not present

## 2018-03-13 DIAGNOSIS — R079 Chest pain, unspecified: Secondary | ICD-10-CM | POA: Diagnosis present

## 2018-03-13 LAB — CBC
HCT: 37.7 % (ref 36.0–46.0)
HEMOGLOBIN: 12.5 g/dL (ref 12.0–15.0)
MCH: 26.3 pg (ref 26.0–34.0)
MCHC: 33.2 g/dL (ref 30.0–36.0)
MCV: 79.4 fL (ref 78.0–100.0)
PLATELETS: 240 10*3/uL (ref 150–400)
RBC: 4.75 MIL/uL (ref 3.87–5.11)
RDW: 14.8 % (ref 11.5–15.5)
WBC: 10.3 10*3/uL (ref 4.0–10.5)

## 2018-03-13 LAB — BASIC METABOLIC PANEL
ANION GAP: 8 (ref 5–15)
BUN: 14 mg/dL (ref 6–20)
CO2: 24 mmol/L (ref 22–32)
CREATININE: 0.92 mg/dL (ref 0.44–1.00)
Calcium: 9.3 mg/dL (ref 8.9–10.3)
Chloride: 107 mmol/L (ref 101–111)
GLUCOSE: 102 mg/dL — AB (ref 65–99)
Potassium: 3.9 mmol/L (ref 3.5–5.1)
Sodium: 139 mmol/L (ref 135–145)

## 2018-03-13 LAB — TROPONIN I

## 2018-03-13 NOTE — ED Triage Notes (Signed)
Patient states that she has had "agitation" in her chest all day. Denies any increase pain with movement. Reports that she is SOB with the movement

## 2018-03-13 NOTE — ED Notes (Signed)
Patient transported to X-ray 

## 2018-03-13 NOTE — ED Notes (Signed)
ED at bedside.  

## 2018-03-14 ENCOUNTER — Observation Stay (HOSPITAL_BASED_OUTPATIENT_CLINIC_OR_DEPARTMENT_OTHER): Payer: BLUE CROSS/BLUE SHIELD

## 2018-03-14 ENCOUNTER — Encounter (HOSPITAL_COMMUNITY): Payer: Self-pay | Admitting: Internal Medicine

## 2018-03-14 DIAGNOSIS — E7800 Pure hypercholesterolemia, unspecified: Secondary | ICD-10-CM

## 2018-03-14 DIAGNOSIS — E119 Type 2 diabetes mellitus without complications: Secondary | ICD-10-CM | POA: Diagnosis not present

## 2018-03-14 DIAGNOSIS — Z7982 Long term (current) use of aspirin: Secondary | ICD-10-CM | POA: Diagnosis not present

## 2018-03-14 DIAGNOSIS — R072 Precordial pain: Secondary | ICD-10-CM | POA: Diagnosis not present

## 2018-03-14 DIAGNOSIS — Z79899 Other long term (current) drug therapy: Secondary | ICD-10-CM | POA: Diagnosis not present

## 2018-03-14 DIAGNOSIS — R0789 Other chest pain: Secondary | ICD-10-CM | POA: Diagnosis present

## 2018-03-14 DIAGNOSIS — I10 Essential (primary) hypertension: Secondary | ICD-10-CM

## 2018-03-14 DIAGNOSIS — R079 Chest pain, unspecified: Secondary | ICD-10-CM

## 2018-03-14 LAB — LIPID PANEL
CHOL/HDL RATIO: 2.9 ratio
Cholesterol: 140 mg/dL (ref 0–200)
HDL: 48 mg/dL (ref >40)
LDL CALC: 78 mg/dL (ref 0–99)
TRIGLYCERIDES: 72 mg/dL (ref <150)
VLDL: 14 mg/dL (ref 0–40)

## 2018-03-14 LAB — GLUCOSE, CAPILLARY
GLUCOSE-CAPILLARY: 94 mg/dL (ref 65–99)
Glucose-Capillary: 99 mg/dL (ref 65–99)

## 2018-03-14 LAB — ECHOCARDIOGRAM COMPLETE
HEIGHTINCHES: 63 in
Weight: 3654.4 oz

## 2018-03-14 LAB — RAPID URINE DRUG SCREEN, HOSP PERFORMED
AMPHETAMINES: NOT DETECTED
Benzodiazepines: NOT DETECTED
COCAINE: NOT DETECTED
Opiates: NOT DETECTED
Tetrahydrocannabinol: NOT DETECTED

## 2018-03-14 LAB — TROPONIN I: Troponin I: <0.03 ng/mL (ref <0.03)

## 2018-03-14 LAB — HEMOGLOBIN A1C
HEMOGLOBIN A1C: 6.5 % — AB (ref 4.8–5.6)
Mean Plasma Glucose: 139.85 mg/dL

## 2018-03-14 LAB — HIV ANTIBODY (ROUTINE TESTING W REFLEX): HIV Screen 4th Generation wRfx: NONREACTIVE

## 2018-03-14 MED ORDER — VITAMIN B-12 1000 MCG PO TABS
1000.0000 ug | ORAL_TABLET | Freq: Every day | ORAL | Status: DC
  Administered 2018-03-14: 1000 ug via ORAL
  Filled 2018-03-14: qty 1

## 2018-03-14 MED ORDER — ACETAMINOPHEN 325 MG PO TABS: 650.0000 mg | ORAL_TABLET | ORAL | Status: DC | PRN

## 2018-03-14 MED ORDER — OXYCODONE HCL 5 MG PO TABS: 5.0000 mg | ORAL_TABLET | ORAL | Status: DC | PRN

## 2018-03-14 MED ORDER — ZOLPIDEM TARTRATE 5 MG PO TABS: 5.0000 mg | ORAL_TABLET | Freq: Every evening | ORAL | Status: DC | PRN

## 2018-03-14 MED ORDER — ONDANSETRON HCL 4 MG/2ML IJ SOLN: 4.0000 mg | Freq: Four times a day (QID) | INTRAMUSCULAR | Status: DC | PRN

## 2018-03-14 MED ORDER — GABAPENTIN 300 MG PO CAPS: 300.0000 mg | ORAL_CAPSULE | Freq: Three times a day (TID) | ORAL | Status: DC | PRN

## 2018-03-14 MED ORDER — ASPIRIN 325 MG PO TABS
325.0000 mg | ORAL_TABLET | Freq: Every day | ORAL | Status: DC
  Administered 2018-03-14: 325 mg via ORAL
  Filled 2018-03-14: qty 1

## 2018-03-14 MED ORDER — ENOXAPARIN SODIUM 40 MG/0.4ML ~~LOC~~ SOLN
40.0000 mg | SUBCUTANEOUS | Status: DC
  Administered 2018-03-14: 40 mg via SUBCUTANEOUS
  Filled 2018-03-14: qty 0.4

## 2018-03-14 MED ORDER — ASPIRIN 81 MG PO CHEW: 324.0000 mg | CHEWABLE_TABLET | Freq: Once | ORAL | Status: DC

## 2018-03-14 MED ORDER — PRAVASTATIN SODIUM 20 MG PO TABS: 20.0000 mg | ORAL_TABLET | Freq: Every day | ORAL | Status: DC

## 2018-03-14 MED ORDER — HYDRALAZINE HCL 20 MG/ML IJ SOLN: 5.0000 mg | INTRAMUSCULAR | Status: DC | PRN

## 2018-03-14 MED ORDER — NITROGLYCERIN 0.4 MG SL SUBL: 0.4000 mg | SUBLINGUAL_TABLET | SUBLINGUAL | Status: DC | PRN

## 2018-03-14 MED ORDER — SODIUM CHLORIDE 0.9 % IV SOLN
INTRAVENOUS | Status: DC
  Administered 2018-03-14: 03:00:00 via INTRAVENOUS

## 2018-03-14 MED ORDER — MORPHINE SULFATE (PF) 2 MG/ML IV SOLN
2.0000 mg | INTRAVENOUS | Status: DC | PRN
  Filled 2018-03-14: qty 1

## 2018-03-14 MED ORDER — INSULIN ASPART 100 UNIT/ML ~~LOC~~ SOLN: 0.0000 [IU] | Freq: Three times a day (TID) | SUBCUTANEOUS | Status: DC

## 2018-03-14 MED ORDER — LOSARTAN POTASSIUM 50 MG PO TABS
50.0000 mg | ORAL_TABLET | Freq: Every day | ORAL | Status: DC
  Administered 2018-03-14: 50 mg via ORAL
  Filled 2018-03-14: qty 1

## 2018-03-14 MED ORDER — INSULIN ASPART 100 UNIT/ML ~~LOC~~ SOLN: 0.0000 [IU] | Freq: Every day | SUBCUTANEOUS | Status: DC

## 2018-03-14 NOTE — ED Notes (Signed)
PT refused morphine, RN returned morphine [facility] mail[facility] because unable to return to pyxis. Marice PotterJon, rN and KAtie, RN as witness.

## 2018-03-14 NOTE — H&P (Signed)
History and Physical    Samantha Santiago:[id number] DOB: [birth year] DOA: 03/13/2018  Referring MD/NP/PA:   PCP: Jackie Plum, MD   Patient coming from:  The patient is coming from home.  At baseline, pt is independent for most of ADL.  Chief Complaint: chest pain  HPI: Samantha Santiago is a 62 y.o. female with medical history significant of hypertension, hyperlipidemia, diabetes mellitus, morbid obesity, who presents with chest pain.  Patient states that her chest pain started since yesterday morning, has been going on for the whole day today.  The chest pain is located in the substernal area, constant, dull,up to 8 out of 10 severity at worst time, nonradiating. Patient has mild dry cough, but denies shortness of breath.  She had diaphoresis.  No fever or chills.  Patient states that she she traveled to and from [place] by car on 5/24.   She does not have tenderness, swelling or erythema in the calf areas. Patient denies nausea, vomiting, diarrhea, abdominal pain, symptoms of UTI or unilateral weakness.  Her chest pain has subsided, currently 0-1 out of 10 in severity.  ED Course: pt was found to have negative troponin, WBC 10.3, electrolytes renal function okay, temperature normal, no tachycardia, oxygen saturation 100% on room air, negative chest x-ray.  Patient is placed on telemetry bed for observation.  Review of Systems:   General: no fevers, chills, no body weight gain, has fatigue HEENT: no blurry vision, hearing changes or sore throat Respiratory: no dyspnea, mild dry coughing, no wheezing CV: has chest pain, no palpitations GI: no nausea, vomiting, abdominal pain, diarrhea, constipation GU: no dysuria, burning on urination, increased urinary frequency, hematuria  Ext: no leg edema Neuro: no unilateral weakness, numbness, or tingling, no vision change or hearing loss Skin: no rash, no skin tear. MSK: No muscle spasm, no deformity, no limitation of range of movement in  spin Heme: No easy bruising.  Travel history: had recent long distant travel.  Allergy: No Known Allergies  Past Medical History:  Diagnosis Date  . Diabetes mellitus without complication (HCC)   . History of stress test 01/27/2011   normal myocardial perfusion study, no significant abnormalities.  . Hypercholesterolemia   . Hypertension   . Morbid obesity (HCC)     Past Surgical History:  Procedure Laterality Date  . CHOLECYSTECTOMY N/A 06/18/2016   Procedure: LAPAROSCOPIC CHOLECYSTECTOMY;  Surgeon: Emelia Loron, MD;  Location: [facility];  Service: General;  Laterality: N/A;  . PARTIAL HYSTERECTOMY  06/29/2001    Social History:  reports that she has never smoked. She has never used smokeless tobacco. She reports that she does not drink alcohol. Her drug history is not on file.  Family History:  Family History  Problem Relation Age of Onset  . Hypertension Sister   . Hyperlipidemia Sister   . Heart disease Father   . Hypertension Father   . Cancer Paternal Grandmother      Prior to Admission medications   Medication Sig Start Date End Date Taking? Authorizing Provider  aspirin EC 81 MG tablet Take 81 mg by mouth daily.    [provider]  gabapentin (NEURONTIN) 300 MG capsule Take 300 mg by mouth 3 (three) times daily as needed (for pain).     [provider]  losartan (COZAAR) 50 MG tablet Take 50 mg by mouth daily.    [provider]  lovastatin (MEVACOR) 20 MG tablet Take 20 mg by mouth daily with breakfast.  [provider]  oxyCODONE (OXY IR/ROXICODONE) 5 MG immediate release tablet Take 1 tablet (5 mg total) by mouth every 4 (four) hours as needed for moderate pain. 06/19/16   Meuth, Lina Sar, PA-C  promethazine (PHENERGAN) 12.5 MG tablet Take 1 tablet (12.5 mg total) by mouth every 6 (six) hours as needed for nausea. 06/20/16   Emelia Loron, MD  vitamin B-12 (CYANOCOBALAMIN) 1000 MCG tablet Take 1,000 mcg by mouth daily.     [provider]  Vitamin D, Ergocalciferol, (DRISDOL) 50000 units CAPS capsule Take 50,000 Units by mouth every 7 (seven) days. Takes on Monday or Friday    [provider]    Physical Exam: Vitals:   03/13/18 2145 03/13/18 2330 03/13/18 2344 03/14/18 0129  BP: (!) 131/53 (!) 150/63 (!) 150/63 (!) 163/57  Pulse: 64  72 (!) 54  Resp: 18 16 16 17   Temp:    99.3 F (37.4 C)  TempSrc:    Oral  SpO2: 99%  100% 99%  Weight:    103.6 kg (228 lb 6.4 oz)  Height:    5\' 3"  (1.6 m)   General: Not in acute distress HEENT:       Eyes: PERRL, EOMI, no scleral icterus.       ENT: No discharge from the ears and nose, no pharynx injection, no tonsillar enlargement.        Neck: No JVD, no bruit, no mass felt. Heme: No neck lymph node enlargement. Cardiac: S1/S2, RRR, 1/6 systolic murmurs, No gallops or rubs. Respiratory: no rales, wheezing, rhonchi or rubs. GI: Soft, nondistended, nontender, no rebound pain, no organomegaly, BS present. GU: No hematuria Ext: No pitting leg edema bilaterally. 2+DP/PT pulse bilaterally. Musculoskeletal: No joint deformities, No joint redness or warmth, no limitation of ROM in spin. Skin: No rashes.  Neuro: Alert, oriented X3, cranial nerves II-XII grossly intact, moves all extremities normally.  Psych: Patient is not psychotic, no suicidal or hemocidal ideation.  Labs on Admission: I have personally reviewed following labs and imaging studies  CBC: Recent Labs  Lab 03/13/18 1943  WBC 10.3  HGB 12.5  HCT 37.7  MCV 79.4  PLT 240   Basic Metabolic Panel: Recent Labs  Lab 03/13/18 1943  NA 139  K 3.9  CL 107  CO2 24  GLUCOSE 102*  BUN 14  CREATININE 0.92  CALCIUM 9.3   GFR: Estimated Creatinine Clearance: 75.8 mL/min (by C-G formula based on SCr of 0.92 mg/dL). Liver Function Tests: No results for input(s): AST, ALT, ALKPHOS, BILITOT, PROT, ALBUMIN in the last 168 hours. No results for input(s): LIPASE, AMYLASE in the last  168 hours. No results for input(s): AMMONIA in the last 168 hours. Coagulation Profile: No results for input(s): INR, PROTIME in the last 168 hours. Cardiac Enzymes: Recent Labs  Lab 03/13/18 1943 03/14/18 0014  TROPONINI <0.03 <0.03   BNP (last 3 results) No results for input(s): PROBNP in the last 8760 hours. HbA1C: No results for input(s): HGBA1C in the last 72 hours. CBG: No results for input(s): GLUCAP in the last 168 hours. Lipid Profile: No results for input(s): CHOL, HDL, LDLCALC, TRIG, CHOLHDL, LDLDIRECT in the last 72 hours. Thyroid Function Tests: No results for input(s): TSH, T4TOTAL, FREET4, T3FREE, THYROIDAB in the last 72 hours. Anemia Panel: No results for input(s): VITAMINB12, FOLATE, FERRITIN, TIBC, IRON, RETICCTPCT in the last 72 hours. Urine analysis:    Component Value Date/Time   COLORURINE YELLOW 06/17/2016 1215   APPEARANCEUR CLEAR 06/17/2016  1215   LABSPEC 1.031 (H) 06/17/2016 1215   PHURINE 7.5 06/17/2016 1215   GLUCOSEU 100 (A) 06/17/2016 1215   HGBUR SMALL (A) 06/17/2016 1215   BILIRUBINUR NEGATIVE 06/17/2016 1215   KETONESUR NEGATIVE 06/17/2016 1215   PROTEINUR NEGATIVE 06/17/2016 1215   NITRITE NEGATIVE 06/17/2016 1215   LEUKOCYTESUR NEGATIVE 06/17/2016 1215   Sepsis Labs: @LABRCNTIP (procalcitonin:4,lacticidven:4) )No results found for this or any previous visit (from the past 240 hour(s)).   Radiological Exams on Admission: Dg Chest 2 View  Result Date: 03/13/2018 CLINICAL DATA:  Chest pressure for 2 days. Bilateral hand numbness. Back pain, headache, nausea. Ex-smoker. EXAM: CHEST - 2 VIEW COMPARISON:  Chest x-ray dated 11/17/2015. FINDINGS: Heart size is upper normal, stable. Overall cardiomediastinal silhouette is stable. Lungs are clear. No pleural effusion or pneumothorax seen. Osseous structures about the chest are unremarkable. IMPRESSION: No active cardiopulmonary disease. No evidence of pneumonia or pulmonary edema. Electronically  Signed   By: Bary RichardStan  Maynard M.D.   On: 03/13/2018 20:18    EKG: Independently reviewed.  Sinus rhythm, QTC 408, anteroseptal infarction pattern, poor R wave progression, low voltage, nonspecific T wave change.  Assessment/Plan Principal Problem:   Chest pain Active Problems:   Hypertension   Hypercholesterolemia   Diabetes mellitus without complication (HCC)   Chest pain: Patient has recent long distance traveling, but no any signs of DVT.  No shortness of breath or oxygen desaturation.  I have low suspicion for PE.  Given history of multiple risk factors including hypertension, hyperlipidemia and diabetes.  Will do chest pain rule out work-up.  - will place on Tele bed for obs - cycle CE q6 x3 and repeat EKG in the am  - prn Nitroglycerin, Morphine, and aspirin, pravastatin - Risk factor stratification: will check FLP, UDS and A1C  - 2d echo - inpt card consult was requested via Epic  HTN:  -Continue home medications: Losartan -IV hydralazine prn  Hypercholesterolemia: -Pravastatin  Diabetes mellitus without complication (HCC): Last A1c not on record. Patient is taking metformin at home.  Blood sugar 102 -SSI -f/u A1c    DVT ppx:    SQ Lovenox Code Status: Full code Family Communication: None at bed side Disposition Plan:  Anticipate discharge back to previous home environment Consults called:  none Admission status: Obs / tele      Date of Service 03/14/2018    Lorretta HarpXilin Jilian [facility] Pager [phone]  If 7PM-7AM, please contact night-coverage [email or web address] Password [email or web address] 03/14/2018, 2:23 AM

## 2018-03-14 NOTE — Progress Notes (Signed)
  Echocardiogram 2D Echocardiogram has been performed.  Samantha Santiago 03/14/2018, 11:19 AM

## 2018-03-14 NOTE — ED Provider Notes (Signed)
[facility] EMERGENCY DEPARTMENT Provider Note   CSN: [id number] Arrival date & time: 03/13/18  1926     History   Chief Complaint Chief Complaint  Patient presents with  . Chest Pain    HPI Samantha Santiago is a 62 y.o. female.  Patient with a history of diabetes hypertension and high cholesterol.  With onset of chest pain yesterday morning at about 8 in the morning.  Constant throughout the day.  Pain of left substernal kind of radiating towards the left breast.  Not made worse or better by anything not associated with shortness of breath.  Some intermittent diaphoresis yesterday.  Pain got worse today pain was still present when she arrived but it was improving and did not completely resolve until sometime after 8 at o'clock this evening.  Patient had increase episodes of diaphoresis today.  Patient's past medical history states that she had a stress test done in 2012 the patient denies any recollection of having a stress test.  Patient does take a baby aspirin a day.  Patient without any known heart disease.     Past Medical History:  Diagnosis Date  . Diabetes mellitus without complication (HCC)   . History of stress test 01/27/2011   normal myocardial perfusion study, no significant abnormalities.  . Hypercholesterolemia   . Hypertension   . Morbid obesity [facility])     Patient Active Problem List   Diagnosis Date Noted  . Chest pain 03/14/2018  . Symptomatic cholelithiasis 06/17/2016    Past Surgical History:  Procedure Laterality Date  . CHOLECYSTECTOMY N/A 06/18/2016   Procedure: LAPAROSCOPIC CHOLECYSTECTOMY;  Surgeon: Emelia Loron, MD;  Location: [facility] ORS;  Service: General;  Laterality: N/A;  . PARTIAL HYSTERECTOMY  06/29/2001     OB History   None      Home Medications    Prior to Admission medications   Medication Sig Start Date End Date Taking? Authorizing Provider  aspirin EC 81 MG tablet Take 81 mg by mouth daily.    [provider]  gabapentin (NEURONTIN) 300 MG capsule Take 300 mg by mouth 3 (three) times daily as needed (for pain).     [provider]  losartan (COZAAR) 50 MG tablet Take 50 mg by mouth daily.    [provider]  lovastatin (MEVACOR) 20 MG tablet Take 20 mg by mouth daily with breakfast.     [provider]  oxyCODONE (OXY IR/ROXICODONE) 5 MG immediate release tablet Take 1 tablet (5 mg total) by mouth every 4 (four) hours as needed for moderate pain. 06/19/16   Meuth, Lina Sar, PA-C  promethazine (PHENERGAN) 12.5 MG tablet Take 1 tablet (12.5 mg total) by mouth every 6 (six) hours as needed for nausea. 06/20/16   Emelia Loron, MD  vitamin B-12 (CYANOCOBALAMIN) 1000 MCG tablet Take 1,000 mcg by mouth daily.    [provider]  Vitamin D, Ergocalciferol, (DRISDOL) 50000 units CAPS capsule Take 50,000 Units by mouth every 7 (seven) days. Takes on Monday or Friday    [provider]    Family History Family History  Problem Relation Age of Onset  . Hypertension Sister   . Hyperlipidemia Sister   . Heart disease Father   . Hypertension Father   . Cancer Paternal Grandmother     Social History Social History   Tobacco Use  . Smoking status: Never Smoker  . Smokeless tobacco: Never Used  Substance Use Topics  . Alcohol use: No  .  Drug use: Not on file     Allergies   Patient has no known allergies.   Review of Systems Review of Systems  Constitutional: Positive for diaphoresis. Negative for fever.  HENT: Negative for congestion.   Eyes: Negative for visual disturbance.  Respiratory: Negative for shortness of breath.   Cardiovascular: Positive for chest pain.  Gastrointestinal: Negative for abdominal pain, nausea and vomiting.  Genitourinary: Negative for dysuria.  Musculoskeletal: Negative for back pain.  Skin: Negative for rash.  Neurological: Negative for syncope.  Hematological: Does not bruise/bleed easily.    Psychiatric/Behavioral: Negative for confusion.     Physical Exam Updated Vital Signs BP (!) 150/63   Pulse 72   Temp 98.7 F (37.1 C) (Oral)   Resp 16   Ht 1.6 m (5\' 3" )   Wt 107.5 kg (237 lb)   SpO2 100%   BMI 41.98 kg/m   Physical Exam  Constitutional: She is oriented to person, place, and time. She appears well-developed and well-nourished. No distress.  HENT:  Head: Normocephalic and atraumatic.  Mouth/Throat: Oropharynx is clear and moist.  Eyes: Pupils are equal, round, and reactive to light. Conjunctivae and EOM are normal.  Neck: Neck supple.  Cardiovascular: Normal rate, regular rhythm and normal heart sounds.  Pulmonary/Chest: Effort normal and breath sounds normal. No respiratory distress.  Abdominal: Soft. Bowel sounds are normal. There is no tenderness.  Neurological: She is alert and oriented to person, place, and time. No cranial nerve deficit or sensory deficit. She exhibits normal muscle tone. Coordination normal.  Skin: Skin is warm.  Nursing note and vitals reviewed.    ED Treatments / Results  Labs (all labs ordered are listed, but only abnormal results are displayed) Labs Reviewed  BASIC METABOLIC PANEL - Abnormal; Notable for the following components:      Result Value   Glucose, Bld 102 (*)    All other components within normal limits  CBC  TROPONIN I  TROPONIN I    EKG EKG Interpretation  Date/Time:  Saturday March 13 2018 19:32:49 EDT Ventricular Rate:  60 PR Interval:  150 QRS Duration: 84 QT Interval:  408 QTC Calculation: 408 R Axis:   52 Text Interpretation:  Normal sinus rhythm Cannot rule out Anterior infarct , age undetermined Abnormal ECG No significant change since last tracing Reconfirmed by Vanetta MuldersZackowski, Cleota Pellerito [phone]([id number]) on 03/13/2018 10:39:47 PM   Radiology Dg Chest 2 View  Result Date: 03/13/2018 CLINICAL DATA:  Chest pressure for 2 days. Bilateral hand numbness. Back pain, headache, nausea. Ex-smoker. EXAM: CHEST - 2 VIEW  COMPARISON:  Chest x-ray dated 11/17/2015. FINDINGS: Heart size is upper normal, stable. Overall cardiomediastinal silhouette is stable. Lungs are clear. No pleural effusion or pneumothorax seen. Osseous structures about the chest are unremarkable. IMPRESSION: No active cardiopulmonary disease. No evidence of pneumonia or pulmonary edema. Electronically Signed   By: Bary RichardStan  Maynard M.D.   On: 03/13/2018 20:18    Procedures Procedures (including critical care time)  Medications Ordered in ED Medications  nitroGLYCERIN (NITROSTAT) SL tablet 0.4 mg (has no administration in time range)  morphine 2 MG/ML injection 2 mg (has no administration in time range)  aspirin tablet 325 mg (has no administration in time range)     Initial Impression / Assessment and Plan / ED Course  I have reviewed the triage vital signs and the nursing notes.  Pertinent labs & imaging results that were available during my care of the patient were reviewed by me and considered in  my medical decision making (see chart for details).     Patient now pain-free.  The patient with moderate heart score.  Patient will require cardiac admission for rule out.  Work-up here EKG without acute changes first troponin negative.  Chest x-ray negative.  Patient will be given full aspirin course.  Discussed with hospitalist at [facility] they will admit.  Dr. Clyde Lundborg.  Patient will be transported by [facility].  Final Clinical Impressions(s) / ED Diagnoses   Final diagnoses:  Precordial pain    ED Discharge Orders    None       Vanetta Mulders, MD 03/14/18 [phone]

## 2018-03-14 NOTE — Care Management Note (Signed)
This is a no charge note  [facility] Deretha EmoryZackowski  62 year old lady with past medical history of hypertension, hyperlipidemia, diabetes mellitus, morbid obesity, who presents with chest pain started yesterday morning.  Associated with diaphoresis.  Currently chest pain-free. EKG showed anteroseptal infarction pattern, poor R wave progression and low voltage.  Patient was found to have negative troponin, WBC 10.3, electrolytes renal function okay, temperature normal, no tachycardia, oxygen saturation 100% on room air, negative chest x-ray.  Patient is placed on telemetry bed for observation.  Please call manager of [facility] at [phone] when pt arrives to floor   Lorretta HarpXilin Raenette Sakata, MD  [facility] Pager [phone]  If 7PM-7AM, please contact night-coverage [email or web address] Password [email or web address] 03/14/2018, 12:31 AM

## 2018-03-14 NOTE — Discharge Summary (Signed)
Physician Discharge Summary  Samantha BeaverFlora J Cost [id number] DOB: 11/15/1955 DOA: 03/13/2018  PCP: Jackie Plumsei-Bonsu, George, MD  Admit date: 03/13/2018 Discharge date: 03/14/2018  Admitted From: Home Disposition:  Home  Recommendations for Outpatient Follow-up:  1. Follow up with Cardiology in 1-2 weeks for stress test 2. Please obtain BMP/CBC in one week 3. Follow up PCP in 2 week follow up on her A1c  Home Health: No Equipment/Devices: No  Discharge Condition: Stable CODE STATUS: Full Diet recommendation: Heart Healthy  Brief/Interim Summary: Samantha BeaverFlora J Knupp is an 62 y.o. female past medical history of hypertension, diabetes morbid obesity who presents with atypical chest pain    Discharge Diagnoses:  Principal Problem:   Chest pain Active Problems:   Hypertension   Hypercholesterolemia   Diabetes mellitus without complication (HCC)  Chest pain: Cardiac biomarkers are negative, twelve-lead EKG showed normal sinus rhythm. Cardiology consulted rec.2D echo which showed no effusion and follow up with them for stress test as an outpatient He is already on a statin  Essential hypertension Continue losartan blood pressure seems to be well controlled.  Hypercholesterolemia Continue statins.  Discharge Instructions  Discharge Instructions    Diet - low sodium heart healthy   Complete by:  As directed    Increase activity slowly   Complete by:  As directed      Allergies as of 03/14/2018   No Known Allergies     Medication List    TAKE these medications   aspirin EC 81 MG tablet Take 81 mg by mouth daily.   losartan 50 MG tablet Commonly known as:  COZAAR Take 50 mg by mouth daily.   lovastatin 20 MG tablet Commonly known as:  MEVACOR Take 20 mg by mouth daily with breakfast.   metFORMIN 500 MG tablet Commonly known as:  GLUCOPHAGE Take 500 mg by mouth daily.   oxyCODONE 5 MG immediate release tablet Commonly known as:  Oxy IR/ROXICODONE Take 1 tablet (5 mg  total) by mouth every 4 (four) hours as needed for moderate pain.   promethazine 12.5 MG tablet Commonly known as:  PHENERGAN Take 1 tablet (12.5 mg total) by mouth every 6 (six) hours as needed for nausea.   vitamin B-12 1000 MCG tablet Commonly known as:  CYANOCOBALAMIN Take 1,000 mcg by mouth daily.   Vitamin D (Ergocalciferol) 50000 units Caps capsule Commonly known as:  DRISDOL Take 50,000 Units by mouth every 7 (seven) days. Takes on Mondays       No Known Allergies  Consultations:  Cardiology   Procedures/Studies: Dg Chest 2 View  Result Date: 03/13/2018 CLINICAL DATA:  Chest pressure for 2 days. Bilateral hand numbness. Back pain, headache, nausea. Ex-smoker. EXAM: CHEST - 2 VIEW COMPARISON:  Chest x-ray dated 11/17/2015. FINDINGS: Heart size is upper normal, stable. Overall cardiomediastinal silhouette is stable. Lungs are clear. No pleural effusion or pneumothorax seen. Osseous structures about the chest are unremarkable. IMPRESSION: No active cardiopulmonary disease. No evidence of pneumonia or pulmonary edema. Electronically Signed   By: Bary RichardStan  Maynard M.D.   On: 03/13/2018 20:18      Subjective: No complains  Discharge Exam: Vitals:   03/14/18 0800 03/14/18 1139  BP: (!) 122/52 136/61  Pulse: (!) 57 (!) 52  Resp: 18 18  Temp: 98.2 F (36.8 C) 97.8 F (36.6 C)  SpO2: 99% 100%   Vitals:   03/14/18 0129 03/14/18 0413 03/14/18 0800 03/14/18 1139  BP: (!) 163/57 129/71 (!) 122/52 136/61  Pulse: (!) 54 (!) 57 Marland Kitchen(!)  57 (!) 52  Resp: 17 18 18 18   Temp: 99.3 F (37.4 C) 98.2 F (36.8 C) 98.2 F (36.8 C) 97.8 F (36.6 C)  TempSrc: Oral Oral Oral Oral  SpO2: 99% 99% 99% 100%  Weight: 103.6 kg (228 lb 6.4 oz)     Height: 5\' 3"  (1.6 m)       General: Pt is alert, awake, not in acute distress Cardiovascular: RRR, S1/S2 +, no rubs, no gallops Respiratory: CTA bilaterally, no wheezing, no rhonchi Abdominal: Soft, NT, ND, bowel sounds + Extremities: no  edema, no cyanosis    The results of significant diagnostics from this hospitalization (including imaging, microbiology, ancillary and laboratory) are listed below for reference.     Microbiology: No results found for this or any previous visit (from the past 240 hour(s)).   Labs: BNP (last 3 results) No results for input(s): BNP in the last 8760 hours. Basic Metabolic Panel: Recent Labs  Lab 03/13/18 1943  NA 139  K 3.9  CL 107  CO2 24  GLUCOSE 102*  BUN 14  CREATININE 0.92  CALCIUM 9.3   Liver Function Tests: No results for input(s): AST, ALT, ALKPHOS, BILITOT, PROT, ALBUMIN in the last 168 hours. No results for input(s): LIPASE, AMYLASE in the last 168 hours. No results for input(s): AMMONIA in the last 168 hours. CBC: Recent Labs  Lab 03/13/18 1943  WBC 10.3  HGB 12.5  HCT 37.7  MCV 79.4  PLT 240   Cardiac Enzymes: Recent Labs  Lab 03/13/18 1943 03/14/18 0014 03/14/18 0628 03/14/18 1202  TROPONINI <0.03 <0.03 <0.03 <0.03   BNP: Invalid input(s): POCBNP CBG: Recent Labs  Lab 03/14/18 0742 03/14/18 1136  GLUCAP 94 99   D-Dimer No results for input(s): DDIMER in the last 72 hours. Hgb A1c Recent Labs    03/14/18 0627  HGBA1C 6.5*   Lipid Profile Recent Labs    03/14/18 0628  CHOL 140  HDL 48  LDLCALC 78  TRIG 72  CHOLHDL 2.9   Thyroid function studies No results for input(s): TSH, T4TOTAL, T3FREE, THYROIDAB in the last 72 hours.  Invalid input(s): FREET3 Anemia work up No results for input(s): VITAMINB12, FOLATE, FERRITIN, TIBC, IRON, RETICCTPCT in the last 72 hours. Urinalysis    Component Value Date/Time   COLORURINE YELLOW 06/17/2016 1215   APPEARANCEUR CLEAR 06/17/2016 1215   LABSPEC 1.031 (H) 06/17/2016 1215   PHURINE 7.5 06/17/2016 1215   GLUCOSEU 100 (A) 06/17/2016 1215   HGBUR SMALL (A) 06/17/2016 1215   BILIRUBINUR NEGATIVE 06/17/2016 1215   KETONESUR NEGATIVE 06/17/2016 1215   PROTEINUR NEGATIVE 06/17/2016 1215    NITRITE NEGATIVE 06/17/2016 1215   LEUKOCYTESUR NEGATIVE 06/17/2016 1215   Sepsis Labs Invalid input(s): PROCALCITONIN,  WBC,  LACTICIDVEN Microbiology No results found for this or any previous visit (from the past 240 hour(s)).   Time coordinating discharge: 40 minutes  SIGNED:   Marinda Elk, MD  [facility] 03/14/2018, 3:19 PM Pager   If 7PM-7AM, please contact night-coverage [email or web address] Password [email or web address]

## 2018-03-14 NOTE — Plan of Care (Signed)
  Problem: Education: Goal: Knowledge of General Education information will improve Outcome: Progressing   Problem: Health Behavior/Discharge Planning: Goal: Ability to manage health-related needs will improve Outcome: Progressing   Problem: Clinical Measurements: Goal: Ability to maintain clinical measurements within normal limits will improve Outcome: Progressing Goal: Will remain free from infection Outcome: Progressing Goal: Diagnostic test results will improve Outcome: Progressing Goal: Respiratory complications will improve Outcome: Progressing Goal: Cardiovascular complication will be avoided Outcome: Progressing   Problem: Activity: Goal: Risk for activity intolerance will decrease Outcome: Progressing   Problem: Nutrition: Goal: Adequate nutrition will be maintained Outcome: Progressing   Problem: Coping: Goal: Level of anxiety will decrease Outcome: Progressing   Problem: Elimination: Goal: Will not experience complications related to bowel motility Outcome: Progressing Goal: Will not experience complications related to urinary retention Outcome: Progressing   Problem: Pain Managment: Goal: General experience of comfort will improve Outcome: Progressing   Problem: Safety: Goal: Ability to remain free from injury will improve Outcome: Progressing   Problem: Skin Integrity: Goal: Risk for impaired skin integrity will decrease Outcome: Progressing    Pt. able to rest in intervals thru out the night. Even and unlabored breathing. Telemetry monitoring in place. VS stable. No acute distress noted. Hourly rounding completed. Bed locked and low. Call bell within reach. Bedside shift report will be given to oncoming nurse.

## 2018-03-14 NOTE — Consult Note (Addendum)
Cardiology Consultation:   Patient ID: [name]; [id number]; August 23, 1956   Admit date: 03/13/2018 Date of Consult: 03/14/2018  Primary Care Provider: Jackie Plum, MD Primary Cardiologist:New to [facility]  Patient Profile:   Samantha Santiago is a 62 y.o. female with a hx of of DM (not on any medication), HLD, HTN and obesity who is being seen today for the evaluation of Chest pain  at the request of Dr. Clyde Lundborg.   No prior cardiac hx. Remote hx of tobacco smoking as teenager. Denies alcohol or illicit drug use.  Father died of MI at age 8.  No regular exercise.  History of Present Illness:   Samantha Santiago was in usual state of health up until Friday when she noted lower sternal/epigastric chest pain.  She described her pain as constant dull pressure and burning.  Her pain exacerbated with going up and down over the stair with shortness of breath.  Intermittent nausea.  Questionable radiation to left shoulder.  Her pain was constant at 3 out of 10 and exasperating to 8/10 with exertional activity.  He denies fever, chills, cough, congestion, palpitation, dizziness, orthopnea, PND, syncope, lower extremity edema or abdominal pain no prior history of GERD.  Due to ongoing symptoms he came for further evaluation.  However, her pain eventually subsided in ER.  He was admitted for rule out.  Past Medical History:  Diagnosis Date  . Diabetes mellitus without complication (HCC)   . History of stress test 01/27/2011   normal myocardial perfusion study, no significant abnormalities.  . Hypercholesterolemia   . Hypertension   . Morbid obesity (HCC)     Past Surgical History:  Procedure Laterality Date  . CHOLECYSTECTOMY N/A 06/18/2016   Procedure: LAPAROSCOPIC CHOLECYSTECTOMY;  Surgeon: Emelia Loron, MD;  Location: [facility];  Service: General;  Laterality: N/A;  . PARTIAL HYSTERECTOMY  06/29/2001     Inpatient Medications: Scheduled Meds: . aspirin  324 mg Oral Once  . aspirin  325 mg  Oral Daily  . enoxaparin (LOVENOX) injection  40 mg Subcutaneous Q24H  . insulin aspart  0-5 Units Subcutaneous QHS  . insulin aspart  0-9 Units Subcutaneous TID WC  . losartan  50 mg Oral Daily  . pravastatin  20 mg Oral q1800  . vitamin B-12  1,000 mcg Oral Daily   Continuous Infusions:  PRN Meds: acetaminophen, gabapentin, hydrALAZINE, morphine injection, nitroGLYCERIN, ondansetron (ZOFRAN) IV, oxyCODONE, zolpidem  Allergies:   No Known Allergies  Social History:   Social History   Socioeconomic History  . Marital status: Married    Spouse name: Not on file  . Number of children: Not on file  . Years of education: Not on file  . Highest education level: Not on file  Occupational History  . Not on file  Social Needs  . Financial resource strain: Not on file  . Food insecurity:    Worry: Not on file    Inability: Not on file  . Transportation needs:    Medical: Not on file    Non-medical: Not on file  Tobacco Use  . Smoking status: Never Smoker  . Smokeless tobacco: Never Used  Substance and Sexual Activity  . Alcohol use: No  . Drug use: Not on file  . Sexual activity: Not on file  Lifestyle  . Physical activity:    Days per week: Not on file    Minutes per session: Not on file  . Stress: Not on file  Relationships  . Social connections:  Talks on phone: Not on file    Gets together: Not on file    Attends religious service: Not on file    Active member of club or organization: Not on file    Attends meetings of clubs or organizations: Not on file    Relationship status: Not on file  . Intimate partner violence:    Fear of current or ex partner: Not on file    Emotionally abused: Not on file    Physically abused: Not on file    Forced sexual activity: Not on file  Other Topics Concern  . Not on file  Social History Narrative  . Not on file    Family History:   Family History  Problem Relation Age of Onset  . Hypertension Sister   .  Hyperlipidemia Sister   . Heart disease Father   . Hypertension Father   . Cancer Paternal Grandmother      ROS:  Please see the history of present illness All other ROS reviewed and negative.     Physical Exam/Data:   Vitals:   03/13/18 2330 03/13/18 2344 03/14/18 0129 03/14/18 0413  BP: (!) 150/63 (!) 150/63 (!) 163/57 129/71  Pulse:  72 (!) 54 (!) 57  Resp: 16 16 17 18   Temp:   99.3 F (37.4 C) 98.2 F (36.8 C)  TempSrc:   Oral Oral  SpO2:  100% 99% 99%  Weight:   228 lb 6.4 oz (103.6 kg)   Height:   5\' 3"  (1.6 m)     Intake/Output Summary (Last 24 hours) at 03/14/2018 0836 Last data filed at 03/14/2018 0600 Gross per 24 hour  Intake 325 ml  Output 300 ml  Net 25 ml   Filed Weights   03/13/18 1931 03/14/18 0129  Weight: 237 lb (107.5 kg) 228 lb 6.4 oz (103.6 kg)   Body mass index is 40.46 kg/m.  General:  Well nourished, well developed, in no acute distress HEENT: normal Lymph: no adenopathy Neck: no JVD Endocrine:  No thryomegaly Vascular: No carotid bruits; FA pulses 2+ bilaterally without bruits  Cardiac:  normal S1, S2; RRR; no murmur.  Chest pain reproducible with palpation at lower sternal area Lungs:  clear to auscultation bilaterally, no wheezing, rhonchi or rales  Abd: soft, nontender, no hepatomegaly  Ext: no edema Musculoskeletal:  No deformities, BUE and BLE strength normal and equal Skin: warm and dry  Neuro:  CNs 2-12 intact, no focal abnormalities noted Psych:  Normal affect   EKG:  The EKG was personally reviewed and demonstrates: Sinus rhythm with nonspecific T wave abnormality-personally reviewed Telemetry:  Telemetry was personally reviewed and demonstrates: Sinus bradycardia at rate of 50  Relevant CV Studies: Pending echo   Laboratory Data:  Chemistry Recent Labs  Lab 03/13/18 1943  NA 139  K 3.9  CL 107  CO2 24  GLUCOSE 102*  BUN 14  CREATININE 0.92  CALCIUM 9.3  GFRNONAA >60  GFRAA >60  ANIONGAP 8     Hematology Recent Labs  Lab 03/13/18 1943  WBC 10.3  RBC 4.75  HGB 12.5  HCT 37.7  MCV 79.4  MCH 26.3  MCHC 33.2  RDW 14.8  PLT 240   Cardiac Enzymes Recent Labs  Lab 03/13/18 1943 03/14/18 0014 03/14/18 0628  TROPONINI <0.03 <0.03 <0.03   Radiology/Studies:  Dg Chest 2 View  Result Date: 03/13/2018 CLINICAL DATA:  Chest pressure for 2 days. Bilateral hand numbness. Back pain, headache, nausea. Ex-smoker. EXAM: CHEST -  2 VIEW COMPARISON:  Chest x-ray dated 11/17/2015. FINDINGS: Heart size is upper normal, stable. Overall cardiomediastinal silhouette is stable. Lungs are clear. No pleural effusion or pneumothorax seen. Osseous structures about the chest are unremarkable. IMPRESSION: No active cardiopulmonary disease. No evidence of pneumonia or pulmonary edema. Electronically Signed   By: Bary Richard M.D.   On: 03/13/2018 20:18    Assessment and Plan:   1. Chest pain -Mostly atypical features.  Patient has ruled out.  Troponin negative.  EKG with nonspecific chronic changes.  Pain reproducible with palpation.  Chest pain-free since admit.  However, she has not ambulated yet.  Cardiac risk factor includes hypertension, hyperlipidemia, borderline diabetic and father having MI at age 42.  Keep n.p.o.  Will review plan with MD.  2.  Hypertension -Blood pressure relatively stable on losartan.  3.  Hyperlipidemia -03/14/2018: Cholesterol 140; HDL 48; LDL Cholesterol 78; Triglycerides 72; VLDL 14  -On statin.   For questions or updates, please contact [facility] Please consult [email or web address] for contact info under Cardiology/STEMI.   Lorelei Pont, [place]  03/14/2018 8:36 AM   History and all data above reviewed.  Patient examined.  I agree with the findings as above.  The patient presents with chest pain since Friday.  She says that it is lower sternal pain with radiation to both of her shoulders.  It has been constant.  She reports some SOB, nausea,  diaphoresis.  No prior cardiac history but she does have risk factors.  She does report that the pain is worse lying on her left side and better sitting up.  No acute ST T wave changes.  Troponin normal x 3.     The patient exam reveals COR:RRR, no rub  ,  Lungs: Clear  ,  Abd: Positive bowel sounds, no rebound no guarding, Ext No edema.  Chest:  Mild tenderness to palpation  .  All available labs, radiology testing, previous records reviewed. Agree with documented assessment and plan.   Chest pain:  Atypical for angina and without objective evidence of ischemia but positive risk factors.  Plan echocardiogram to look for effusion or other evidence of pericarditis.  If OK then discharge home with plans for out patient Lexiscan Myoview.  Fayrene Fearing Imanii Gosdin  9:07 AM  03/14/2018

## 2018-11-21 ENCOUNTER — Emergency Department (HOSPITAL_BASED_OUTPATIENT_CLINIC_OR_DEPARTMENT_OTHER)
Admission: EM | Admit: 2018-11-21 | Discharge: 2018-11-21 | Disposition: A | Discharge status: Home / Self Care | Payer: BLUE CROSS/BLUE SHIELD | Attending: Emergency Medicine | Admitting: Emergency Medicine

## 2018-11-21 ENCOUNTER — Encounter (HOSPITAL_BASED_OUTPATIENT_CLINIC_OR_DEPARTMENT_OTHER): Payer: Self-pay | Admitting: *Deleted

## 2018-11-21 ENCOUNTER — Emergency Department (HOSPITAL_BASED_OUTPATIENT_CLINIC_OR_DEPARTMENT_OTHER): Payer: BLUE CROSS/BLUE SHIELD

## 2018-11-21 ENCOUNTER — Other Ambulatory Visit: Payer: Self-pay

## 2018-11-21 DIAGNOSIS — Z3202 Encounter for pregnancy test, result negative: Secondary | ICD-10-CM | POA: Insufficient documentation

## 2018-11-21 DIAGNOSIS — I10 Essential (primary) hypertension: Secondary | ICD-10-CM | POA: Diagnosis not present

## 2018-11-21 DIAGNOSIS — R319 Hematuria, unspecified: Secondary | ICD-10-CM | POA: Diagnosis not present

## 2018-11-21 DIAGNOSIS — Z7982 Long term (current) use of aspirin: Secondary | ICD-10-CM | POA: Diagnosis not present

## 2018-11-21 DIAGNOSIS — R197 Diarrhea, unspecified: Secondary | ICD-10-CM | POA: Insufficient documentation

## 2018-11-21 DIAGNOSIS — E119 Type 2 diabetes mellitus without complications: Secondary | ICD-10-CM | POA: Insufficient documentation

## 2018-11-21 DIAGNOSIS — R1012 Left upper quadrant pain: Secondary | ICD-10-CM

## 2018-11-21 DIAGNOSIS — R51 Headache: Secondary | ICD-10-CM | POA: Insufficient documentation

## 2018-11-21 DIAGNOSIS — Z7984 Long term (current) use of oral hypoglycemic drugs: Secondary | ICD-10-CM | POA: Diagnosis not present

## 2018-11-21 DIAGNOSIS — R112 Nausea with vomiting, unspecified: Secondary | ICD-10-CM | POA: Diagnosis not present

## 2018-11-21 DIAGNOSIS — Z79899 Other long term (current) drug therapy: Secondary | ICD-10-CM | POA: Insufficient documentation

## 2018-11-21 LAB — URINALYSIS, ROUTINE W REFLEX MICROSCOPIC
Bilirubin Urine: NEGATIVE
Glucose, UA: NEGATIVE mg/dL
KETONES UR: NEGATIVE mg/dL
LEUKOCYTE UA: NEGATIVE
NITRITE: NEGATIVE
PH: 5.5 (ref 5.0–8.0)
Protein, ur: NEGATIVE mg/dL
Specific Gravity, Urine: >1.03 — ABNORMAL HIGH (ref 1.005–1.030)

## 2018-11-21 LAB — COMPREHENSIVE METABOLIC PANEL
ALT: 31 U/L (ref 0–44)
AST: 27 U/L (ref 15–41)
Albumin: 4.2 g/dL (ref 3.5–5.0)
Alkaline Phosphatase: 82 U/L (ref 38–126)
Anion gap: 8 (ref 5–15)
BUN: 16 mg/dL (ref 6–20)
CHLORIDE: 106 mmol/L (ref 98–111)
CO2: 22 mmol/L (ref 22–32)
CREATININE: 0.77 mg/dL (ref 0.44–1.00)
Calcium: 8.9 mg/dL (ref 8.9–10.3)
Glucose, Bld: 144 mg/dL — ABNORMAL HIGH (ref 70–99)
POTASSIUM: 3.8 mmol/L (ref 3.5–5.1)
Sodium: 136 mmol/L (ref 135–145)
Total Bilirubin: 0.5 mg/dL (ref 0.3–1.2)
Total Protein: 7.5 g/dL (ref 6.5–8.1)

## 2018-11-21 LAB — CBC
HEMATOCRIT: 41.8 % (ref 36.0–46.0)
HEMOGLOBIN: 12.8 g/dL (ref 12.0–15.0)
MCH: 25.2 pg — ABNORMAL LOW (ref 26.0–34.0)
MCHC: 30.6 g/dL (ref 30.0–36.0)
MCV: 82.4 fL (ref 80.0–100.0)
NRBC: 0 % (ref 0.0–0.2)
Platelets: 250 10*3/uL (ref 150–400)
RBC: 5.07 MIL/uL (ref 3.87–5.11)
RDW: 15.1 % (ref 11.5–15.5)
WBC: 11.8 10*3/uL — AB (ref 4.0–10.5)

## 2018-11-21 LAB — URINALYSIS, MICROSCOPIC (REFLEX)

## 2018-11-21 LAB — PREGNANCY, URINE: PREG TEST UR: NEGATIVE

## 2018-11-21 LAB — LIPASE, BLOOD: LIPASE: 34 U/L (ref 11–51)

## 2018-11-21 MED ORDER — KETOROLAC TROMETHAMINE 30 MG/ML IJ SOLN
15.0000 mg | Freq: Once | INTRAMUSCULAR | Status: AC
Start: 2018-11-21 — End: 2018-11-21
  Administered 2018-11-21: 15 mg via INTRAVENOUS
  Filled 2018-11-21: qty 1

## 2018-11-21 MED ORDER — METHOCARBAMOL 500 MG PO TABS: 500.0000 mg | ORAL_TABLET | Freq: Three times a day (TID) | ORAL | 0 refills | Status: AC | PRN

## 2018-11-21 MED ORDER — LIDOCAINE VISCOUS HCL 2 % MT SOLN
15.0000 mL | Freq: Once | OROMUCOSAL | Status: AC
  Administered 2018-11-21: 15 mL via ORAL
  Filled 2018-11-21: qty 15

## 2018-11-21 MED ORDER — ALUM & MAG HYDROXIDE-SIMETH 200-200-20 MG/5ML PO SUSP
30.0000 mL | Freq: Once | ORAL | Status: AC
  Administered 2018-11-21: 30 mL via ORAL
  Filled 2018-11-21: qty 30

## 2018-11-21 MED ORDER — ONDANSETRON HCL 4 MG/2ML IJ SOLN
4.0000 mg | Freq: Once | INTRAMUSCULAR | Status: AC
  Administered 2018-11-21: 4 mg via INTRAVENOUS
  Filled 2018-11-21: qty 2

## 2018-11-21 MED ORDER — ONDANSETRON 4 MG PO TBDP: 4.0000 mg | ORAL_TABLET | Freq: Three times a day (TID) | ORAL | 0 refills | Status: AC | PRN

## 2018-11-21 MED ORDER — SODIUM CHLORIDE 0.9 % IV BOLUS
1000.0000 mL | Freq: Once | INTRAVENOUS | Status: AC
Start: 2018-11-21 — End: 2018-11-21
  Administered 2018-11-21: 1000 mL via INTRAVENOUS

## 2018-11-21 NOTE — ED Notes (Signed)
[facility] blood draw completed, tubes in lab

## 2018-11-21 NOTE — ED Provider Notes (Signed)
[facility] EMERGENCY DEPARTMENT Provider Note   CSN: [id number] Arrival date & time: 11/21/18  4098    History   Chief Complaint Chief Complaint  Patient presents with  . headache, abd pain    HPI Samantha Santiago is a 63 y.o. female.     HPI Patient presents with nausea vomiting diarrhea upper abdominal pain and headache.  Began yesterday.  Has had feeling she is cold.  Started after eating some noodles and chicken.  States other people ate the food but did not get sick.  Headache is dull.  Goes from her sinuses up.  No photophobia.  Headache began before the vomiting started.  Abdominal pain is dull.  More in the left upper abdomen.  She has had a previous cholecystectomy. Past Medical History:  Diagnosis Date  . Diabetes mellitus without complication (HCC)   . History of stress test 01/27/2011   normal myocardial perfusion study, no significant abnormalities.  . Hypercholesterolemia   . Hypertension   . Morbid obesity [facility])     Patient Active Problem List   Diagnosis Date Noted  . Chest pain 03/14/2018  . Hypertension   . Hypercholesterolemia   . Diabetes mellitus without complication (HCC)   . Symptomatic cholelithiasis 06/17/2016    Past Surgical History:  Procedure Laterality Date  . CHOLECYSTECTOMY N/A 06/18/2016   Procedure: LAPAROSCOPIC CHOLECYSTECTOMY;  Surgeon: Emelia Loron, MD;  Location: [facility];  Service: General;  Laterality: N/A;  . PARTIAL HYSTERECTOMY  06/29/2001     OB History   No obstetric history on file.      Home Medications    Prior to Admission medications   Medication Sig Start Date End Date Taking? Authorizing Provider  aspirin EC 81 MG tablet Take 81 mg by mouth daily.    [provider]  losartan (COZAAR) 50 MG tablet Take 50 mg by mouth daily.    [provider]  lovastatin (MEVACOR) 20 MG tablet Take 20 mg by mouth daily with breakfast.     [provider]  metFORMIN (GLUCOPHAGE) 500 MG  tablet Take 500 mg by mouth daily. 09/03/17   [provider]  methocarbamol (ROBAXIN) 500 MG tablet Take 1 tablet (500 mg total) by mouth every 8 (eight) hours as needed for muscle spasms. 11/21/18   Benjiman Core, MD  oxyCODONE (OXY IR/ROXICODONE) 5 MG immediate release tablet Take 1 tablet (5 mg total) by mouth every 4 (four) hours as needed for moderate pain. Patient not taking: Reported on 03/14/2018 06/19/16   Franne Forts, PA-C  promethazine (PHENERGAN) 12.5 MG tablet Take 1 tablet (12.5 mg total) by mouth every 6 (six) hours as needed for nausea. Patient not taking: Reported on 03/14/2018 06/20/16   Emelia Loron, MD  vitamin B-12 (CYANOCOBALAMIN) 1000 MCG tablet Take 1,000 mcg by mouth daily.    [provider]  Vitamin D, Ergocalciferol, (DRISDOL) 50000 units CAPS capsule Take 50,000 Units by mouth every 7 (seven) days. Takes on Mondays    [provider]    Family History Family History  Problem Relation Age of Onset  . Hypertension Sister   . Hyperlipidemia Sister   . Heart disease Father   . Hypertension Father   . Cancer Paternal Grandmother     Social History Social History   Tobacco Use  . Smoking status: Never Smoker  . Smokeless tobacco: Never Used  Substance Use Topics  . Alcohol use: No  . Drug use: Never  Allergies   Patient has no known allergies.   Review of Systems Review of Systems  Constitutional: Positive for appetite change and chills.  HENT: Negative for congestion.   Eyes: Negative for photophobia.  Respiratory: Negative for shortness of breath.   Gastrointestinal: Positive for abdominal pain, diarrhea, nausea and vomiting.  Genitourinary: Negative for flank pain.  Musculoskeletal: Negative for back pain.  Skin: Negative for rash.  Neurological: Positive for headaches. Negative for weakness.  Psychiatric/Behavioral: Negative for confusion.     Physical Exam Updated Vital Signs BP (!) 129/57 (BP  Location: Left Arm)   Pulse 78   Temp 99.5 F (37.5 C) (Oral)   Resp 18   Ht 5' 3.5" (1.613 m)   Wt 107.5 kg   SpO2 98%   BMI 41.32 kg/m   Physical Exam Vitals signs and nursing note reviewed.  HENT:     Head: Atraumatic.  Eyes:     Pupils: Pupils are equal, round, and reactive to light.  Neck:     Musculoskeletal: Neck supple.  Cardiovascular:     Rate and Rhythm: Regular rhythm.  Pulmonary:     Effort: Pulmonary effort is normal.  Abdominal:     Comments: Left upper quadrant tenderness without rebound or guarding.  Musculoskeletal:     Right lower leg: No edema.     Left lower leg: No edema.  Skin:    General: Skin is warm.     Capillary Refill: Capillary refill takes less than 2 seconds.  Neurological:     General: No focal deficit present.     Mental Status: She is alert.      ED Treatments / Results  Labs (all labs ordered are listed, but only abnormal results are displayed) Labs Reviewed  COMPREHENSIVE METABOLIC PANEL - Abnormal; Notable for the following components:      Result Value   Glucose, Bld 144 (*)    All other components within normal limits  CBC - Abnormal; Notable for the following components:   WBC 11.8 (*)    MCH 25.2 (*)    All other components within normal limits  URINALYSIS, ROUTINE W REFLEX MICROSCOPIC - Abnormal; Notable for the following components:   APPearance HAZY (*)    Specific Gravity, Urine >1.030 (*)    Hgb urine dipstick MODERATE (*)    All other components within normal limits  URINALYSIS, MICROSCOPIC (REFLEX) - Abnormal; Notable for the following components:   Bacteria, UA MANY (*)    All other components within normal limits  LIPASE, BLOOD  PREGNANCY, URINE    EKG None  Radiology Ct Renal Stone Study  Result Date: 11/21/2018 CLINICAL DATA:  Upper abdominal pain after eating yesterday. Vomited x4. Stone disease suspected. Flank pain. EXAM: CT ABDOMEN AND PELVIS WITHOUT CONTRAST TECHNIQUE: Multidetector CT  imaging of the abdomen and pelvis was performed following the standard protocol without IV contrast. COMPARISON:  06/17/2016 abdominal ultrasound. 06/16/2016 CT. FINDINGS: Lower chest: Clear lung bases. Mild cardiomegaly, without pericardial or pleural effusion. Small hiatal hernia. Hepatobiliary: Normal liver. Cholecystectomy, without biliary ductal dilatation. Pancreas: Normal, without mass or ductal dilatation. Spleen: Normal in size, without focal abnormality. Adrenals/Urinary Tract: Normal right adrenal gland. Mild left adrenal thickening. No renal calculi or hydronephrosis. No hydroureter or ureteric calculi. No bladder calculi. Stomach/Bowel: Normal remainder of the stomach. Normal colon, appendix, and terminal ileum. Normal small bowel. Vascular/Lymphatic: Aortic and branch vessel atherosclerosis. No abdominopelvic adenopathy. Reproductive: Hysterectomy.  No adnexal mass. Other: No significant free fluid. Musculoskeletal:  No acute osseous abnormality. IMPRESSION: 1. No urinary tract calculi or hydronephrosis.  Normal appendix. 2. Low sensitivity exam otherwise, secondary to stone study technique. 3. Small hiatal hernia. Electronically Signed   By: Jeronimo Greaves M.D.   On: 11/21/2018 11:37    Procedures Procedures (including critical care time)  Medications Ordered in ED Medications  sodium chloride 0.9 % bolus 1,000 mL (0 mLs Intravenous Stopped 11/21/18 0918)  ondansetron (ZOFRAN) injection 4 mg (4 mg Intravenous Given 11/21/18 0727)  ketorolac (TORADOL) 30 MG/ML injection 15 mg (15 mg Intravenous Given 11/21/18 0942)  alum & mag hydroxide-simeth (MAALOX/MYLANTA) 200-200-20 MG/5ML suspension 30 mL (30 mLs Oral Given 11/21/18 1124)    And  lidocaine (XYLOCAINE) 2 % viscous mouth solution 15 mL (15 mLs Oral Given 11/21/18 1125)     Initial Impression / Assessment and Plan / ED Course  I have reviewed the triage vital signs and the nursing notes.  Pertinent labs & imaging results that were  available during my care of the patient were reviewed by me and considered in my medical decision making (see chart for details).        Patient with headache and abdominal pain.  Left upper abdomen pain.  Work-up reassuring.  CT scan done due to some hematuria.  CT scan reassuring although was done without contrast.  No fevers.  Will treat symptomatically have follow-up as an outpatient.  Final Clinical Impressions(s) / ED Diagnoses   Final diagnoses:  Left upper quadrant pain  Hematuria, unspecified type    ED Discharge Orders         Ordered    methocarbamol (ROBAXIN) 500 MG tablet  Every 8 hours PRN     11/21/18 1224           Benjiman Core, MD 11/21/18 1226

## 2018-11-21 NOTE — ED Notes (Signed)
ED Provider at bedside. 

## 2018-11-21 NOTE — ED Notes (Signed)
Pt still has not urinated. 

## 2018-11-21 NOTE — ED Notes (Signed)
Notified lab to add on orders

## 2018-11-21 NOTE — ED Triage Notes (Signed)
C/o frontal h/a that started yesterday. C/o upper general abd pain that started after eating noodles and chicken yesterday. C/o vomiting times 4. C/o diarrhea. Fevers unknown. Denies urinary symptoms.

## 2019-12-08 ENCOUNTER — Other Ambulatory Visit: Payer: Self-pay

## 2019-12-08 ENCOUNTER — Encounter (HOSPITAL_BASED_OUTPATIENT_CLINIC_OR_DEPARTMENT_OTHER): Payer: Self-pay

## 2019-12-08 ENCOUNTER — Emergency Department (HOSPITAL_BASED_OUTPATIENT_CLINIC_OR_DEPARTMENT_OTHER)
Admission: EM | Admit: 2019-12-08 | Discharge: 2019-12-08 | Disposition: A | Discharge status: Home / Self Care | Payer: 59 | Attending: Emergency Medicine | Admitting: Emergency Medicine

## 2019-12-08 ENCOUNTER — Emergency Department (HOSPITAL_BASED_OUTPATIENT_CLINIC_OR_DEPARTMENT_OTHER): Payer: 59

## 2019-12-08 DIAGNOSIS — Z7982 Long term (current) use of aspirin: Secondary | ICD-10-CM | POA: Diagnosis not present

## 2019-12-08 DIAGNOSIS — E119 Type 2 diabetes mellitus without complications: Secondary | ICD-10-CM | POA: Diagnosis not present

## 2019-12-08 DIAGNOSIS — I10 Essential (primary) hypertension: Secondary | ICD-10-CM | POA: Diagnosis not present

## 2019-12-08 DIAGNOSIS — Z7984 Long term (current) use of oral hypoglycemic drugs: Secondary | ICD-10-CM | POA: Diagnosis not present

## 2019-12-08 DIAGNOSIS — N201 Calculus of ureter: Secondary | ICD-10-CM | POA: Diagnosis not present

## 2019-12-08 DIAGNOSIS — Z79899 Other long term (current) drug therapy: Secondary | ICD-10-CM | POA: Insufficient documentation

## 2019-12-08 DIAGNOSIS — R109 Unspecified abdominal pain: Secondary | ICD-10-CM | POA: Diagnosis present

## 2019-12-08 LAB — COMPREHENSIVE METABOLIC PANEL
ALT: 40 U/L (ref 0–44)
AST: 33 U/L (ref 15–41)
Albumin: 4 g/dL (ref 3.5–5.0)
Alkaline Phosphatase: 73 U/L (ref 38–126)
Anion gap: 9 (ref 5–15)
BUN: 18 mg/dL (ref 8–23)
CO2: 23 mmol/L (ref 22–32)
Calcium: 9.2 mg/dL (ref 8.9–10.3)
Chloride: 105 mmol/L (ref 98–111)
Creatinine, Ser: 0.93 mg/dL (ref 0.44–1.00)
GFR calc Af Amer: >60 mL/min (ref >60)
GFR calc non Af Amer: >60 mL/min (ref >60)
Glucose, Bld: 150 mg/dL — ABNORMAL HIGH (ref 70–99)
Potassium: 3.6 mmol/L (ref 3.5–5.1)
Sodium: 137 mmol/L (ref 135–145)
Total Bilirubin: 0.5 mg/dL (ref 0.3–1.2)
Total Protein: 7.3 g/dL (ref 6.5–8.1)

## 2019-12-08 LAB — CBC WITH DIFFERENTIAL/PLATELET
Abs Immature Granulocytes: 0.06 10*3/uL (ref 0.00–0.07)
Basophils Absolute: 0 10*3/uL (ref 0.0–0.1)
Basophils Relative: 0 %
Eosinophils Absolute: 0 10*3/uL (ref 0.0–0.5)
Eosinophils Relative: 0 %
HCT: 38.5 % (ref 36.0–46.0)
Hemoglobin: 12.6 g/dL (ref 12.0–15.0)
Immature Granulocytes: 0 %
Lymphocytes Relative: 8 %
Lymphs Abs: 1.1 10*3/uL (ref 0.7–4.0)
MCH: 26.5 pg (ref 26.0–34.0)
MCHC: 32.7 g/dL (ref 30.0–36.0)
MCV: 80.9 fL (ref 80.0–100.0)
Monocytes Absolute: 0.7 10*3/uL (ref 0.1–1.0)
Monocytes Relative: 4 %
Neutro Abs: 13.4 10*3/uL — ABNORMAL HIGH (ref 1.7–7.7)
Neutrophils Relative %: 88 %
Platelets: 265 10*3/uL (ref 150–400)
RBC: 4.76 MIL/uL (ref 3.87–5.11)
RDW: 14.1 % (ref 11.5–15.5)
WBC: 15.3 10*3/uL — ABNORMAL HIGH (ref 4.0–10.5)
nRBC: 0 % (ref 0.0–0.2)

## 2019-12-08 LAB — URINALYSIS, ROUTINE W REFLEX MICROSCOPIC
Bilirubin Urine: NEGATIVE
Glucose, UA: NEGATIVE mg/dL
Ketones, ur: NEGATIVE mg/dL
Leukocytes,Ua: NEGATIVE
Nitrite: NEGATIVE
Protein, ur: 30 mg/dL — AB
Specific Gravity, Urine: >1.03 — ABNORMAL HIGH (ref 1.005–1.030)
pH: 5.5 (ref 5.0–8.0)

## 2019-12-08 LAB — LIPASE, BLOOD: Lipase: 28 U/L (ref 11–51)

## 2019-12-08 LAB — URINALYSIS, MICROSCOPIC (REFLEX)

## 2019-12-08 MED ORDER — SODIUM CHLORIDE 0.9 % IV BOLUS
1000.0000 mL | Freq: Once | INTRAVENOUS | Status: AC
Start: 2019-12-08 — End: 2019-12-08
  Administered 2019-12-08: 16:00:00 1000 mL via INTRAVENOUS

## 2019-12-08 MED ORDER — HYDROMORPHONE HCL 1 MG/ML IJ SOLN
1.0000 mg | Freq: Once | INTRAMUSCULAR | Status: AC
  Administered 2019-12-08: 17:00:00 1 mg via INTRAVENOUS
  Filled 2019-12-08: qty 1

## 2019-12-08 MED ORDER — HYDROCODONE-ACETAMINOPHEN 5-325 MG PO TABS: 1.0000 | ORAL_TABLET | ORAL | 0 refills | Status: AC | PRN

## 2019-12-08 MED ORDER — ONDANSETRON HCL 4 MG/2ML IJ SOLN
4.0000 mg | Freq: Once | INTRAMUSCULAR | Status: AC
  Administered 2019-12-08: 16:00:00 4 mg via INTRAVENOUS
  Filled 2019-12-08: qty 2

## 2019-12-08 MED ORDER — HYDROMORPHONE HCL 1 MG/ML IJ SOLN
1.0000 mg | Freq: Once | INTRAMUSCULAR | Status: AC
  Administered 2019-12-08: 1 mg via INTRAVENOUS
  Filled 2019-12-08: qty 1

## 2019-12-08 MED ORDER — KETOROLAC TROMETHAMINE 15 MG/ML IJ SOLN
15.0000 mg | Freq: Once | INTRAMUSCULAR | Status: AC
  Administered 2019-12-08: 15 mg via INTRAVENOUS
  Filled 2019-12-08: qty 1

## 2019-12-08 NOTE — ED Notes (Signed)
PT with eyes closed, appears drowsy after medication. Pt rates pain 10/10.

## 2019-12-08 NOTE — ED Triage Notes (Signed)
Pt c/o right side abd pain, n/v started ~11am-NAD-slow gait

## 2019-12-08 NOTE — ED Provider Notes (Signed)
[facility] EMERGENCY DEPARTMENT Provider Note   CSN: [id number] Arrival date & time: 12/08/19  1510     History Chief Complaint  Patient presents with  . Abdominal Pain    Samantha Santiago is a 64 y.o. female.  HPI 64 year old female presents with right-sided abdominal pain.  Started all of a sudden about 4 hours ago.  It is severe and 9 out of 10.  2 extra Tylenol did not help.  Has had nausea and a few episodes of vomiting.  Nothing makes the pain better or worse.  At times it seems to go to her right low back and sometimes across her abdomen.  Never had this before.  No fevers or change in bowel movements.  No urinary symptoms.   Past Medical History:  Diagnosis Date  . Diabetes mellitus without complication (HCC)   . History of stress test 01/27/2011   normal myocardial perfusion study, no significant abnormalities.  . Hypercholesterolemia   . Hypertension   . Morbid obesity [facility])     Patient Active Problem List   Diagnosis Date Noted  . Chest pain 03/14/2018  . Hypertension   . Hypercholesterolemia   . Diabetes mellitus without complication (HCC)   . Symptomatic cholelithiasis 06/17/2016    Past Surgical History:  Procedure Laterality Date  . CHOLECYSTECTOMY N/A 06/18/2016   Procedure: LAPAROSCOPIC CHOLECYSTECTOMY;  Surgeon: Emelia Loron, MD;  Location: [facility] ORS;  Service: General;  Laterality: N/A;  . PARTIAL HYSTERECTOMY  06/29/2001     OB History   No obstetric history on file.     Family History  Problem Relation Age of Onset  . Hypertension Sister   . Hyperlipidemia Sister   . Heart disease Father   . Hypertension Father   . Cancer Paternal Grandmother     Social History   Tobacco Use  . Smoking status: Never Smoker  . Smokeless tobacco: Never Used  Substance Use Topics  . Alcohol use: No  . Drug use: Never    Home Medications Prior to Admission medications   Medication Sig Start Date End Date Taking? Authorizing Provider   aspirin EC 81 MG tablet Take 81 mg by mouth daily.    [provider]  HYDROcodone-acetaminophen (NORCO) 5-325 MG tablet Take 1 tablet by mouth every 4 (four) hours as needed. 12/08/19   Pricilla Loveless, MD  losartan (COZAAR) 50 MG tablet Take 50 mg by mouth daily.    [provider]  lovastatin (MEVACOR) 20 MG tablet Take 20 mg by mouth daily with breakfast.     [provider]  metFORMIN (GLUCOPHAGE) 500 MG tablet Take 500 mg by mouth daily. 09/03/17   [provider]  methocarbamol (ROBAXIN) 500 MG tablet Take 1 tablet (500 mg total) by mouth every 8 (eight) hours as needed for muscle spasms. 11/21/18   Benjiman Core, MD  ondansetron (ZOFRAN-ODT) 4 MG disintegrating tablet Take 1 tablet (4 mg total) by mouth every 8 (eight) hours as needed for nausea or vomiting. 11/21/18   Benjiman Core, MD  oxyCODONE (OXY IR/ROXICODONE) 5 MG immediate release tablet Take 1 tablet (5 mg total) by mouth every 4 (four) hours as needed for moderate pain. Patient not taking: Reported on 03/14/2018 06/19/16   Franne Forts, PA-C  promethazine (PHENERGAN) 12.5 MG tablet Take 1 tablet (12.5 mg total) by mouth every 6 (six) hours as needed for nausea. Patient not taking: Reported on 03/14/2018 06/20/16   Emelia Loron, MD  vitamin B-12 (CYANOCOBALAMIN) 1000  MCG tablet Take 1,000 mcg by mouth daily.    [provider]  Vitamin D, Ergocalciferol, (DRISDOL) 50000 units CAPS capsule Take 50,000 Units by mouth every 7 (seven) days. Takes on Mondays    [provider]    Allergies    Patient has no known allergies.  Review of Systems   Review of Systems  Constitutional: Negative for fever.  Gastrointestinal: Positive for abdominal pain, nausea and vomiting. Negative for diarrhea.  Genitourinary: Negative for dysuria and hematuria.  Musculoskeletal: Positive for back pain.  Skin: Negative for rash.  All other systems reviewed and are negative.   Physical  Exam Updated Vital Signs BP (!) 168/78 (BP Location: Left Arm)   Pulse 78   Temp 98.4 F (36.9 C) (Oral)   Resp 16   Ht 5\' 3"  (1.6 m)   Wt 104.3 kg   SpO2 99%   BMI 40.74 kg/m   Physical Exam Vitals and nursing note reviewed.  Constitutional:      General: She is not in acute distress.    Appearance: She is well-developed. She is not ill-appearing or diaphoretic.  HENT:     Head: Normocephalic and atraumatic.     Right Ear: External ear normal.     Left Ear: External ear normal.     Nose: Nose normal.  Eyes:     General:        Right eye: No discharge.        Left eye: No discharge.  Cardiovascular:     Rate and Rhythm: Normal rate and regular rhythm.     Heart sounds: Normal heart sounds.  Pulmonary:     Effort: Pulmonary effort is normal.     Breath sounds: Normal breath sounds.  Abdominal:     Palpations: Abdomen is soft.     Tenderness: There is abdominal tenderness (mild) in the right lower quadrant. There is no right CVA tenderness or left CVA tenderness.  Skin:    General: Skin is warm and dry.     Findings: No rash.  Neurological:     Mental Status: She is alert.  Psychiatric:        Mood and Affect: Mood is not anxious.     ED Results / Procedures / Treatments   Labs (all labs ordered are listed, but only abnormal results are displayed) Labs Reviewed  COMPREHENSIVE METABOLIC PANEL - Abnormal; Notable for the following components:      Result Value   Glucose, Bld 150 (*)    All other components within normal limits  CBC WITH DIFFERENTIAL/PLATELET - Abnormal; Notable for the following components:   WBC 15.3 (*)    Neutro Abs 13.4 (*)    All other components within normal limits  URINALYSIS, ROUTINE W REFLEX MICROSCOPIC - Abnormal; Notable for the following components:   Specific Gravity, Urine >1.030 (*)    Hgb urine dipstick TRACE (*)    Protein, ur 30 (*)    All other components within normal limits  URINALYSIS, MICROSCOPIC (REFLEX) - Abnormal;  Notable for the following components:   Bacteria, UA FEW (*)    All other components within normal limits  LIPASE, BLOOD    EKG None  Radiology CT Renal Stone Study  Result Date: 12/08/2019 CLINICAL DATA:  Right-sided flank pain EXAM: CT ABDOMEN AND PELVIS WITHOUT CONTRAST TECHNIQUE: Multidetector CT imaging of the abdomen and pelvis was performed following the standard protocol without IV contrast. COMPARISON:  CT 11/21/2018 FINDINGS: Lower chest: Lung bases  demonstrate no acute consolidation or effusion. Heart size within normal limits. Hepatobiliary: No focal liver abnormality is seen. Status post cholecystectomy. No biliary dilatation. Pancreas: Unremarkable. No pancreatic ductal dilatation or surrounding inflammatory changes. Spleen: Normal in size without focal abnormality. Adrenals/Urinary Tract: Adrenal glands are normal. Mild to moderate right hydronephrosis and hydroureter, secondary to a 2 mm stone at the right UVJ. Moderate right perinephric fat stranding. Stomach/Bowel: Stomach is within normal limits. Appendix appears normal. No evidence of bowel wall thickening, distention, or inflammatory changes. Vascular/Lymphatic: Mild aortic atherosclerosis without aneurysm. No significant adenopathy Reproductive: Status post hysterectomy. No adnexal masses. Other: Negative for free air or free fluid Musculoskeletal: No acute or significant osseous findings. IMPRESSION: 1. Mild to moderate right hydronephrosis and hydroureter, secondary to a 2 mm stone at the right UVJ. 2. Otherwise negative CT of the abdomen and pelvis without contrast. Electronically Signed   By: Donavan Foil M.D.   On: 12/08/2019 16:56    Procedures Procedures (including critical care time)  Medications Ordered in ED Medications  sodium chloride 0.9 % bolus 1,000 mL ( Intravenous Stopped 12/08/19 1719)  HYDROmorphone (DILAUDID) injection 1 mg (1 mg Intravenous Given 12/08/19 1605)  ondansetron (ZOFRAN) injection 4 mg (4 mg  Intravenous Given 12/08/19 1605)  HYDROmorphone (DILAUDID) injection 1 mg (1 mg Intravenous Given 12/08/19 1716)  ketorolac (TORADOL) 15 MG/ML injection 15 mg (15 mg Intravenous Given 12/08/19 1716)    ED Course  I have reviewed the triage vital signs and the nursing notes.  Pertinent labs & imaging results that were available during my care of the patient were reviewed by me and considered in my medical decision making (see chart for details).    MDM Rules/Calculators/A&P                      Patient is found to have a right distal ureteral stone as above.  Feels better with pain control in the ED.  No signs of acute infection.  Will give pain control and refer to urology. Final Clinical Impression(s) / ED Diagnoses Final diagnoses:  Right distal ureteral calculus    Rx / DC Orders ED Discharge Orders         Ordered    HYDROcodone-acetaminophen (NORCO) 5-325 MG tablet  Every 4 hours PRN     12/08/19 1812           Sherwood Gambler, MD 12/08/19 1832

## 2019-12-08 NOTE — Discharge Instructions (Signed)
If you develop fever, uncontrolled vomiting, intractable pain, urinary tract infection symptoms, or any other new/concerning symptoms then return to the ER for evaluation.

## 2021-01-04 IMAGING — CT CT RENAL STONE PROTOCOL
2 of 4 series · 17 of 46 positions shown, 19 images · non-contrast
Comparison: CT 11/21/2018

CLINICAL DATA: Right-sided flank pain

EXAM:
CT ABDOMEN AND PELVIS WITHOUT CONTRAST
TECHNIQUE: Multidetector CT imaging of the abdomen and pelvis was performed
following the standard protocol without IV contrast.

[Series 2: axial st · axial · 0.78mm/px · z∈[+698,+1108]mm · 14 of 90 slices shown, 16 images]
[im 4/90  soft-tissue]
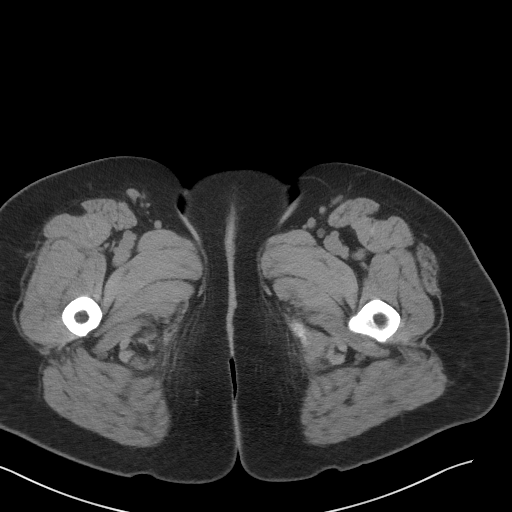
[im 4/90  bone]
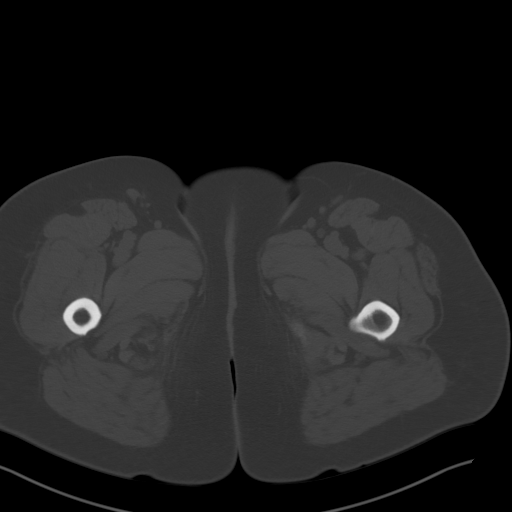
[im 11/90  soft-tissue]
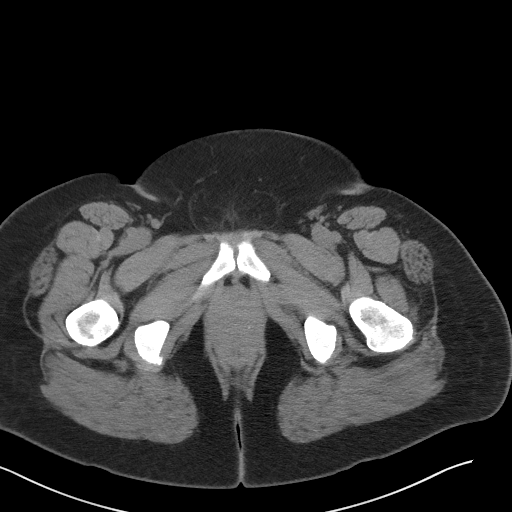
[im 18/90  soft-tissue]
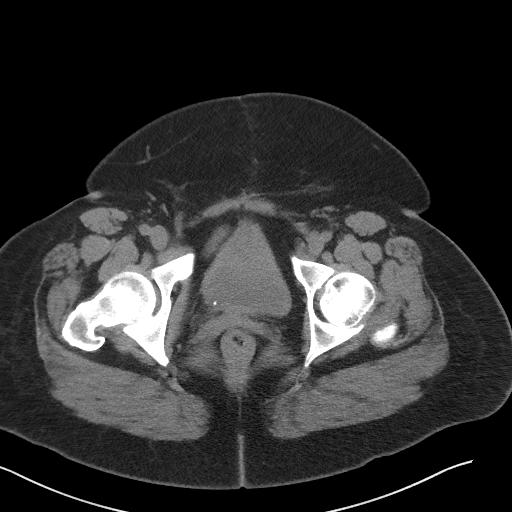
[im 24/90  soft-tissue]
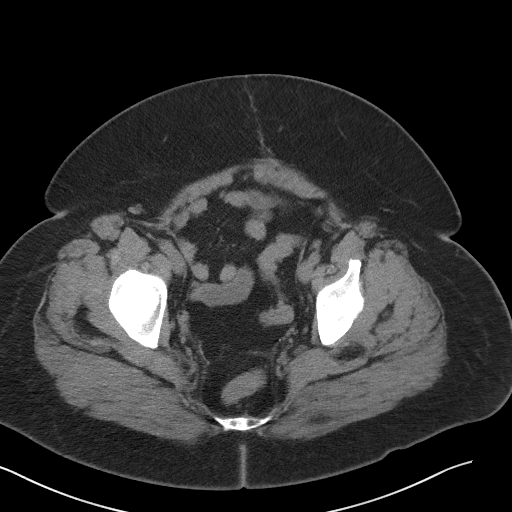
[im 31/90  soft-tissue]
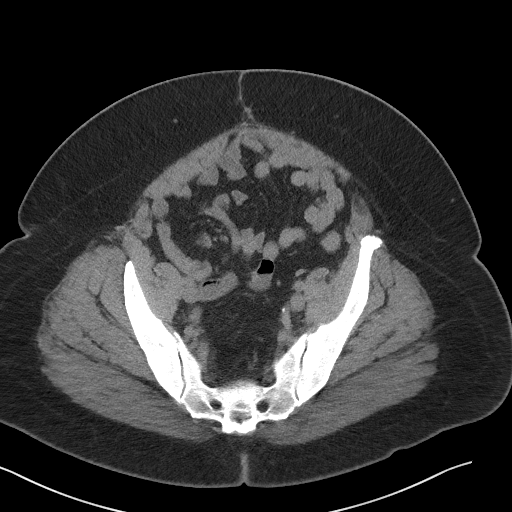
[im 35/90  soft-tissue]
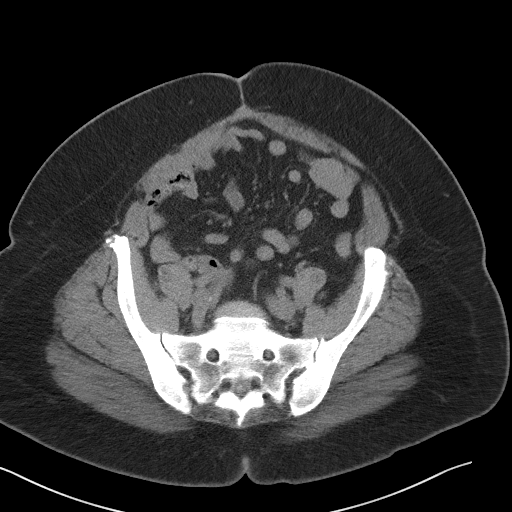
[im 42/90  soft-tissue]
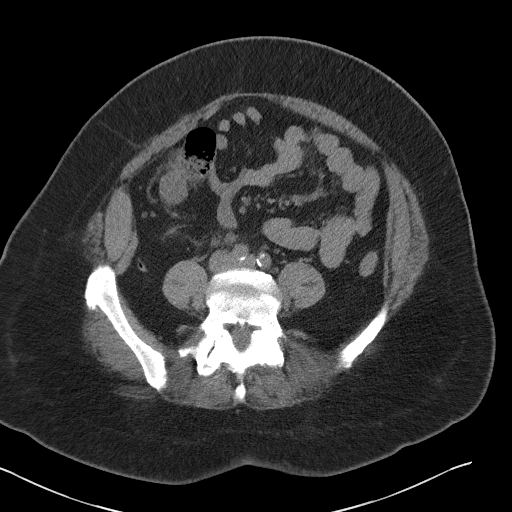
[im 48/90  soft-tissue]
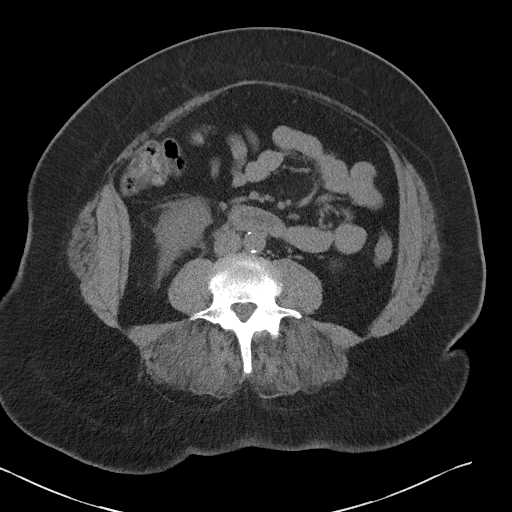
[im 55/90  soft-tissue]
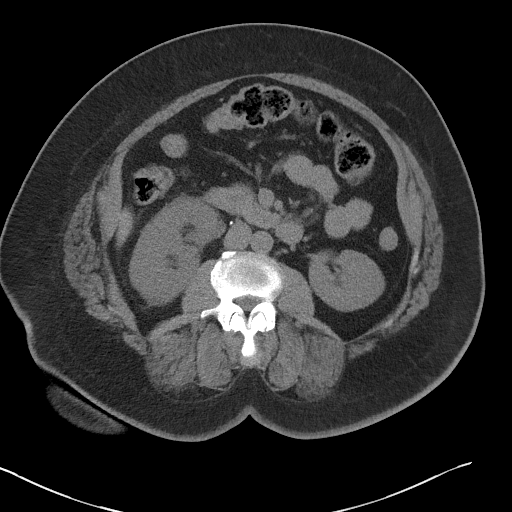
[im 55/90  bone]
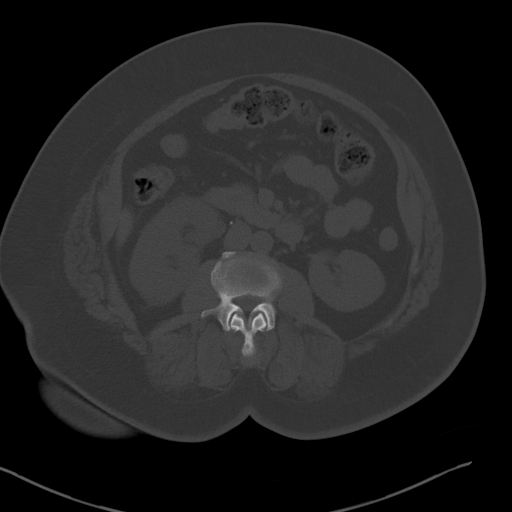
[im 59/90  soft-tissue]
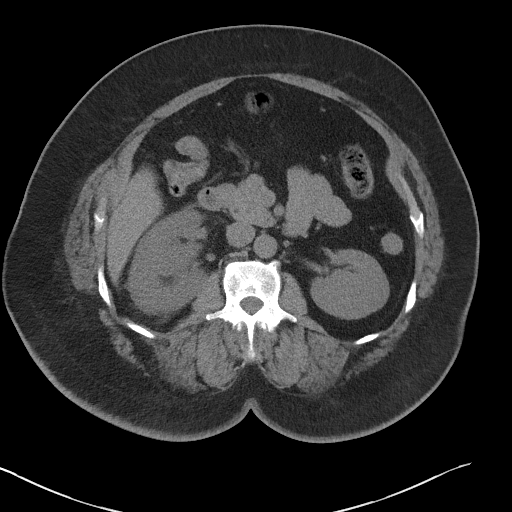
[im 66/90  soft-tissue]
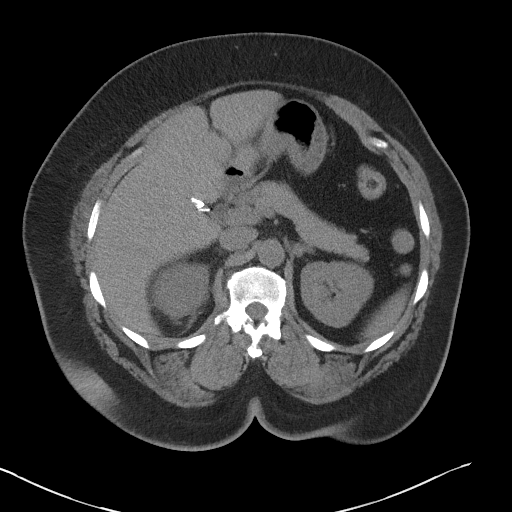
[im 72/90  soft-tissue]
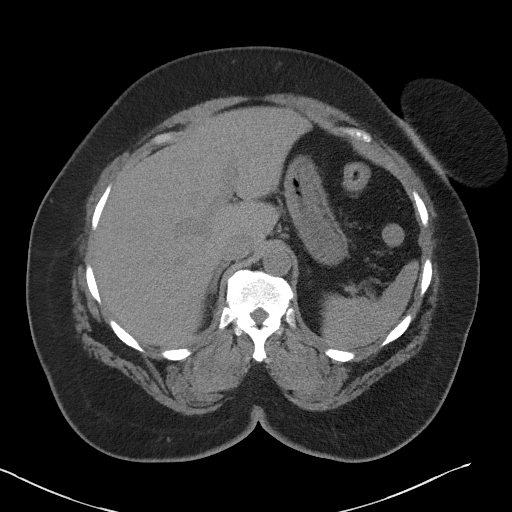
[im 79/90  soft-tissue]
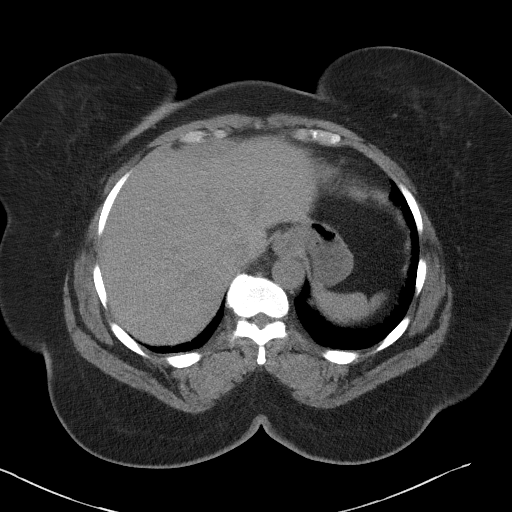
[im 86/90  soft-tissue]
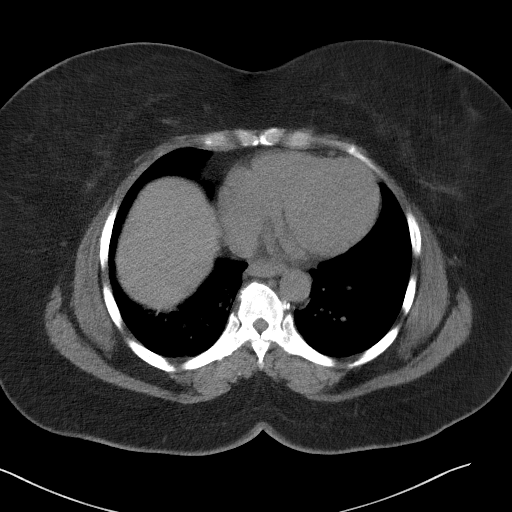

[Series 4: coronal st · coronal · 0.87mm/px · 3 of 107 slices shown]
[im 36/107  soft-tissue]
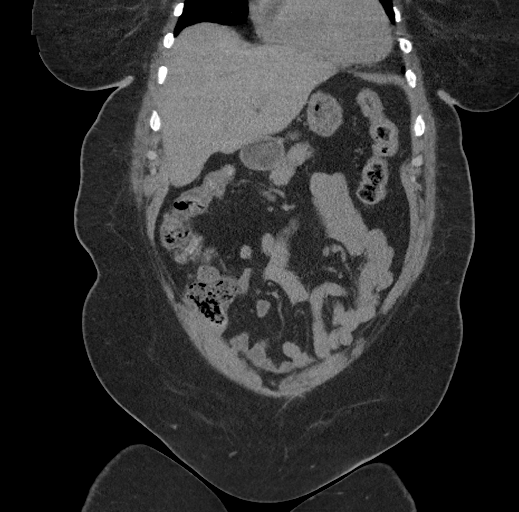
[im 48/107  soft-tissue]
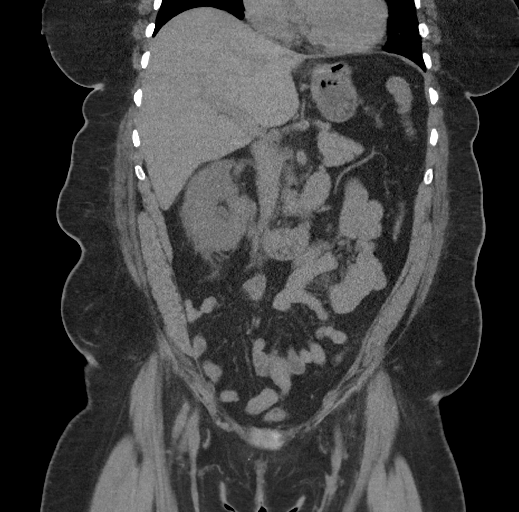
[im 59/107  soft-tissue]
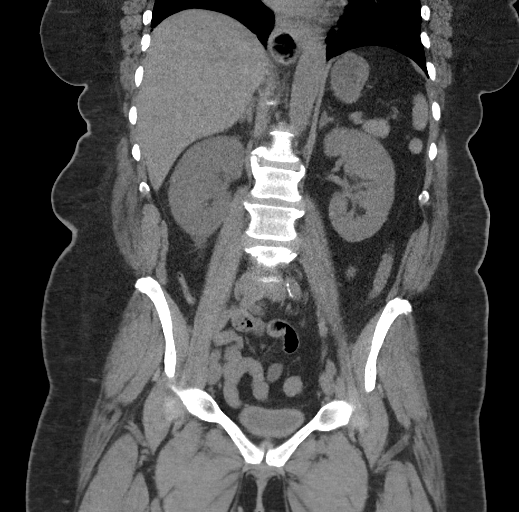

[17 of 46 positions shown; findings below may reference images not displayed]

FINDINGS: Lower chest: Lung bases demonstrate no acute consolidation or
effusion. Heart size within normal limits.

Hepatobiliary: No focal liver abnormality is seen. Status post
cholecystectomy. No biliary dilatation.

Pancreas: Unremarkable. No pancreatic ductal dilatation or
surrounding inflammatory changes.

Spleen: Normal in size without focal abnormality.

Adrenals/Urinary Tract: Adrenal glands are normal. Mild to moderate
right hydronephrosis and hydroureter, secondary to a 2 mm stone at
the right UVJ. Moderate right perinephric fat stranding.

Stomach/Bowel: Stomach is within normal limits. Appendix appears
normal. No evidence of bowel wall thickening, distention, or
inflammatory changes.

Vascular/Lymphatic: Mild aortic atherosclerosis without aneurysm. No
significant adenopathy

Reproductive: Status post hysterectomy. No adnexal masses.

Other: Negative for free air or free fluid

Musculoskeletal: No acute or significant osseous findings.
IMPRESSION: 1. Mild to moderate right hydronephrosis and hydroureter, secondary
to a 2 mm stone at the right UVJ.
2. Otherwise negative CT of the abdomen and pelvis without contrast.
